# Patient Record
Sex: Male | Born: 1995 | Race: Black or African American | Hispanic: No | Marital: Single | Smoking: Former smoker
Health system: Southern US, Community
[De-identification: ages and names within clinical notes are randomized; demographics above are authoritative.]

---
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
## 2015-01-18 ENCOUNTER — Emergency Department (HOSPITAL_COMMUNITY): Payer: Medicaid Other

## 2015-01-18 ENCOUNTER — Encounter (HOSPITAL_COMMUNITY): Payer: Self-pay | Admitting: Emergency Medicine

## 2015-01-18 ENCOUNTER — Emergency Department (HOSPITAL_COMMUNITY)
Admission: EM | Admit: 2015-01-18 | Discharge: 2015-01-18 | Disposition: A | Discharge status: Home / Self Care | Payer: Medicaid Other | Attending: Emergency Medicine | Admitting: Emergency Medicine

## 2015-01-18 DIAGNOSIS — W228XXA Striking against or struck by other objects, initial encounter: Secondary | ICD-10-CM | POA: Diagnosis not present

## 2015-01-18 DIAGNOSIS — S61201A Unspecified open wound of left index finger without damage to nail, initial encounter: Secondary | ICD-10-CM | POA: Diagnosis not present

## 2015-01-18 DIAGNOSIS — Y9389 Activity, other specified: Secondary | ICD-10-CM | POA: Insufficient documentation

## 2015-01-18 DIAGNOSIS — Y998 Other external cause status: Secondary | ICD-10-CM | POA: Diagnosis not present

## 2015-01-18 DIAGNOSIS — Z87891 Personal history of nicotine dependence: Secondary | ICD-10-CM | POA: Insufficient documentation

## 2015-01-18 DIAGNOSIS — S6992XA Unspecified injury of left wrist, hand and finger(s), initial encounter: Secondary | ICD-10-CM

## 2015-01-18 DIAGNOSIS — Y9289 Other specified places as the place of occurrence of the external cause: Secondary | ICD-10-CM | POA: Insufficient documentation

## 2015-01-18 DIAGNOSIS — Z23 Encounter for immunization: Secondary | ICD-10-CM | POA: Diagnosis not present

## 2015-01-18 DIAGNOSIS — S61211A Laceration without foreign body of left index finger without damage to nail, initial encounter: Secondary | ICD-10-CM | POA: Diagnosis present

## 2015-01-18 MED ORDER — IBUPROFEN 600 MG PO TABS: 600.0000 mg | ORAL_TABLET | Freq: Four times a day (QID) | ORAL | Status: DC | PRN

## 2015-01-18 MED ORDER — TETANUS-DIPHTH-ACELL PERTUSSIS 5-2.5-18.5 LF-MCG/0.5 IM SUSP
0.5000 mL | Freq: Once | INTRAMUSCULAR | Status: AC
  Administered 2015-01-18: 0.5 mL via INTRAMUSCULAR
  Filled 2015-01-18: qty 0.5

## 2015-01-18 NOTE — ED Notes (Signed)
Pt c/o left index finger laceration. States he closed his finger in the car door. Bleeding controlled. Ice applied in triage.

## 2015-01-18 NOTE — ED Provider Notes (Signed)
CSN: [id number]     Arrival date & time 01/18/15  1539 History  This chart was scribed for non-physician practitioner, Joycie Peek, PA-C working with Elwin Mocha, MD by Placido Sou, ED scribe. This patient was seen in room WTR8/WTR8 and the patient's care was started at 4:59 PM.   Chief Complaint  Patient presents with  . Finger Injury   The history is provided by the patient. No language interpreter was used.    HPI Comments: Jon Hayes is a 19 y.o. male who presents to the Emergency Department complaining of a laceration with controlled bleeding to his left index finger with onset at 9:00 am. Pt notes slamming his finger in a car door which resulted in his laceration. He notes associated, constant, moderate, pain to the affected finger and further notes wrapping the finger and applying peroxide to the wound until the bleeding was controlled. Pt is unsure of his last tdap vaccination. He denies any other associated symptoms.   History reviewed. No pertinent past medical history. Past Surgical History  Procedure Laterality Date  . Wisdom tooth extraction Bilateral 2013   History reviewed. No pertinent family history. Social History  Substance Use Topics  . Smoking status: Former Games developer  . Smokeless tobacco: None  . Alcohol Use: No    Review of Systems  Musculoskeletal: Positive for arthralgias.  Skin: Positive for wound.  All other systems reviewed and are negative.  Allergies  Review of patient's allergies indicates no known allergies.  Home Medications   Prior to Admission medications   Medication Sig Start Date End Date Taking? Authorizing Provider  ibuprofen (ADVIL,MOTRIN) 600 MG tablet Take 1 tablet (600 mg total) by mouth every 6 (six) hours as needed. 01/18/15   Joycie Peek, PA-C   BP 116/66 mmHg  Pulse 78  Temp(Src) 98.9 F (37.2 C) (Oral)  Resp 16  SpO2 98% Physical Exam  Constitutional: He is oriented to person, place, and time. He appears  well-developed and well-nourished.  HENT:  Head: Normocephalic and atraumatic.  Mouth/Throat: No oropharyngeal exudate.  Eyes: Conjunctivae and EOM are normal. Right eye exhibits no discharge. Left eye exhibits no discharge.  Neck: Normal range of motion. No tracheal deviation present.  Cardiovascular: Normal rate, regular rhythm, normal heart sounds and intact distal pulses.   Pulmonary/Chest: Effort normal. No respiratory distress.  Abdominal: Soft. There is no tenderness.  Musculoskeletal: Normal range of motion.  Diffuse tenderness to distal aspect of left index finger; no subungual hematoma; linear skin tear across volar DIP joint; very superficial; ROM decreased secondary to pain; FROM of PIP  Neurological: He is alert and oriented to person, place, and time.  Motor and sensation intact.  Skin: Skin is warm and dry. He is not diaphoretic.  Psychiatric: He has a normal mood and affect. His behavior is normal.  Nursing note and vitals reviewed.  ED Course  Procedures  DIAGNOSTIC STUDIES: Oxygen Saturation is 98% on RA, normal by my interpretation.    COORDINATION OF CARE: 5:04 PM Discussed treatment plan with pt at bedside including x-rays of the affected finger, a tdap vaccination and recommendations for pain management. Pt agreed to plan.  Labs Review Labs Reviewed - No data to display  Imaging Review Dg Finger Index Left  01/18/2015   CLINICAL DATA:  Car door injury today to index finger with acute pain. Initial encounter.  EXAM: LEFT INDEX FINGER 2+V  COMPARISON:  None.  FINDINGS: There is no evidence of acute fracture, subluxation or dislocation.  Soft tissue swelling is present.  The joint spaces are unremarkable.  No radiopaque foreign bodies are identified.  IMPRESSION: Soft tissue swelling without bony abnormality.   Electronically Signed   By: Harmon Pier M.D.   On: 01/18/2015 16:32   I have personally reviewed and evaluated these images and lab results as part of my  medical decision-making.   EKG Interpretation None     Meds given in ED:  Medications  Tdap (BOOSTRIX) injection 0.5 mL (not administered)    New Prescriptions   IBUPROFEN (ADVIL,MOTRIN) 600 MG TABLET    Take 1 tablet (600 mg total) by mouth every 6 (six) hours as needed.   Filed Vitals:   01/18/15 1556  BP: 116/66  Pulse: 78  Temp: 98.9 F (37.2 C)  TempSrc: Oral  Resp: 16  SpO2: 98%    MDM  Vitals stable - WNL -afebrile Pt resting comfortably in ED. PE--Patient was neurovascularly intact with mild swelling to distal aspect of index finger. Minor skin tear over volar aspect of the DIP No evidence of open fracture. Skin tear does not need repair. Labwork--Tetanus updated in the ED. Imaging--x-rays of left index finger negative for any fracture or other osseous abnormality.  Discussed symptomatic care home with rest, ice, compression and elevation Anti-inflammatory for pain.  I discussed all relevant lab findings and imaging results with pt and they verbalized understanding. Discussed f/u with PCP within 48 hrs and return precautions, pt very amenable to plan.  Final diagnoses:  Finger injury, left, initial encounter   I personally performed the services described in this documentation, which was scribed in my presence. The recorded information has been reviewed and is accurate.    Joycie Peek, PA-C 01/18/15 1721  Elwin Mocha, MD 01/18/15 [phone]

## 2015-01-18 NOTE — Discharge Instructions (Signed)
You were evaluated in the ED for your finger injury. There does not appear to be an emergent cause her symptoms at this time. Urine exam and x-rays are reassuring. There are no broken bones or dislocations.It is important for you to keep the wound clean, using warm water and soap to clean the wound. It may also use ice, compression and elevation to help with the discomfort. Take Motrin as needed for your pain. Follow-up with your doctors as needed. Return to ED for worsening symptoms.

## 2015-06-29 ENCOUNTER — Emergency Department (HOSPITAL_COMMUNITY)
Admission: EM | Admit: 2015-06-29 | Discharge: 2015-06-29 | Disposition: A | Discharge status: Home / Self Care | Payer: Medicaid Other | Attending: Emergency Medicine | Admitting: Emergency Medicine

## 2015-06-29 ENCOUNTER — Encounter (HOSPITAL_COMMUNITY): Payer: Self-pay

## 2015-06-29 DIAGNOSIS — S01511A Laceration without foreign body of lip, initial encounter: Secondary | ICD-10-CM | POA: Diagnosis not present

## 2015-06-29 DIAGNOSIS — Y998 Other external cause status: Secondary | ICD-10-CM | POA: Insufficient documentation

## 2015-06-29 DIAGNOSIS — Y9301 Activity, walking, marching and hiking: Secondary | ICD-10-CM | POA: Diagnosis not present

## 2015-06-29 DIAGNOSIS — W19XXXA Unspecified fall, initial encounter: Secondary | ICD-10-CM

## 2015-06-29 DIAGNOSIS — Y92009 Unspecified place in unspecified non-institutional (private) residence as the place of occurrence of the external cause: Secondary | ICD-10-CM | POA: Diagnosis not present

## 2015-06-29 DIAGNOSIS — Z87891 Personal history of nicotine dependence: Secondary | ICD-10-CM | POA: Diagnosis not present

## 2015-06-29 DIAGNOSIS — W108XXA Fall (on) (from) other stairs and steps, initial encounter: Secondary | ICD-10-CM | POA: Diagnosis not present

## 2015-06-29 DIAGNOSIS — S0993XA Unspecified injury of face, initial encounter: Secondary | ICD-10-CM | POA: Diagnosis present

## 2015-06-29 MED ORDER — LIDOCAINE HCL (PF) 1 % IJ SOLN
5.0000 mL | Freq: Once | INTRAMUSCULAR | Status: DC
  Filled 2015-06-29: qty 5

## 2015-06-29 MED ORDER — BACITRACIN ZINC 500 UNIT/GM EX OINT
TOPICAL_OINTMENT | CUTANEOUS | Status: DC | PRN
  Filled 2015-06-29: qty 0.9

## 2015-06-29 NOTE — ED Notes (Signed)
Per EMS, pt from home.  Pt fell down steps.  Cut to lip.  No head injury.  No LOC.  Bleeding controlled.   Vitals: 144/70, hr 88, resp 18,

## 2015-06-29 NOTE — Discharge Instructions (Signed)
Laceration Care, Adult  A laceration is a cut that goes through all layers of the skin. The cut also goes into the tissue that is right under the skin. Some cuts heal on their own. Others need to be closed with stitches (sutures), staples, skin adhesive strips, or wound glue. Taking care of your cut lowers your risk of infection and helps your cut to heal better.  HOW TO TAKE CARE OF YOUR CUT  For stitches or staples:  · Keep the wound clean and dry.  · If you were given a bandage (dressing), you should change it at least one time per day or as told by your doctor. You should also change it if it gets wet or dirty.  · Keep the wound completely dry for the first 24 hours or as told by your doctor. After that time, you may take a shower or a bath. However, make sure that the wound is not soaked in water until after the stitches or staples have been removed.  · Clean the wound one time each day or as told by your doctor:    Wash the wound with soap and water.    Rinse the wound with water until all of the soap comes off.    Pat the wound dry with a clean towel. Do not rub the wound.  · After you clean the wound, put a thin layer of antibiotic ointment on it as told by your doctor. This ointment:    Helps to prevent infection.    Keeps the bandage from sticking to the wound.  · Have your stitches or staples removed as told by your doctor.  If your doctor used skin adhesive strips:   · Keep the wound clean and dry.  · If you were given a bandage, you should change it at least one time per day or as told by your doctor. You should also change it if it gets dirty or wet.  · Do not get the skin adhesive strips wet. You can take a shower or a bath, but be careful to keep the wound dry.  · If the wound gets wet, pat it dry with a clean towel. Do not rub the wound.  · Skin adhesive strips fall off on their own. You can trim the strips as the wound heals. Do not remove any strips that are still stuck to the wound. They will  fall off after a while.  If your doctor used wound glue:  · Try to keep your wound dry, but you may briefly wet it in the shower or bath. Do not soak the wound in water, such as by swimming.  · After you take a shower or a bath, gently pat the wound dry with a clean towel. Do not rub the wound.  · Do not do any activities that will make you really sweaty until the skin glue has fallen off on its own.  · Do not apply liquid, cream, or ointment medicine to your wound while the skin glue is still on.  · If you were given a bandage, you should change it at least one time per day or as told by your doctor. You should also change it if it gets dirty or wet.  · If a bandage is placed over the wound, do not let the tape for the bandage touch the skin glue.  · Do not pick at the glue. The skin glue usually stays on for 5-10 days. Then, it   falls off of the skin.  General Instructions   · To help prevent scarring, make sure to cover your wound with sunscreen whenever you are outside after stitches are removed, after adhesive strips are removed, or when wound glue stays in place and the wound is healed. Make sure to wear a sunscreen of at least 30 SPF.  · Take over-the-counter and prescription medicines only as told by your doctor.  · If you were given antibiotic medicine or ointment, take or apply it as told by your doctor. Do not stop using the antibiotic even if your wound is getting better.  · Do not scratch or pick at the wound.  · Keep all follow-up visits as told by your doctor. This is important.  · Check your wound every day for signs of infection. Watch for:    Redness, swelling, or pain.    Fluid, blood, or pus.  · Raise (elevate) the injured area above the level of your heart while you are sitting or lying down, if possible.  GET HELP IF:  · You got a tetanus shot and you have any of these problems at the injection site:    Swelling.    Very bad pain.    Redness.    Bleeding.  · You have a fever.  · A wound that was  closed breaks open.  · You notice a bad smell coming from your wound or your bandage.  · You notice something coming out of the wound, such as wood or glass.  · Medicine does not help your pain.  · You have more redness, swelling, or pain at the site of your wound.  · You have fluid, blood, or pus coming from your wound.  · You notice a change in the color of your skin near your wound.  · You need to change the bandage often because fluid, blood, or pus is coming from the wound.  · You start to have a new rash.  · You start to have numbness around the wound.  GET HELP RIGHT AWAY IF:  · You have very bad swelling around the wound.  · Your pain suddenly gets worse and is very bad.  · You notice painful lumps near the wound or on skin that is anywhere on your body.  · You have a red streak going away from your wound.  · The wound is on your hand or foot and you cannot move a finger or toe like you usually can.  · The wound is on your hand or foot and you notice that your fingers or toes look pale or bluish.     This information is not intended to replace advice given to you by your health care provider. Make sure you discuss any questions you have with your health care provider.     Document Released: 10/20/2007 Document Revised: 09/17/2014 Document Reviewed: 04/29/2014  Elsevier Interactive Patient Education ©2016 [employer]

## 2015-06-29 NOTE — ED Provider Notes (Signed)
History  By signing my name below, I, Karle Plumber, attest that this documentation has been prepared under the direction and in the presence of Cheri Fowler, PA-C. Electronically Signed: Karle Plumber, ED Scribe. 06/29/2015. 1:11 PM.  Chief Complaint  Patient presents with  . Facial Laceration   The history is provided by the patient and medical records. No language interpreter was used.    HPI Comments:  Jon Hayes is a 20 y.o. male brought in by EMS, who presents to the Emergency Department complaining of a fall down approximately 6 stairs that occurred about one hour ago. He states his he tripped while walking down the stairs. He reports hitting his top lip on the banister causing a laceration. He reports associated bleeding that has since been controlled. He has not done anything to treat the symptoms. He denies modifying factors. He denies LOC, HA, neck pain, light-headedness, dizziness, nausea, vomiting. He states his tetanus vaccination is UTD.   History reviewed. No pertinent past medical history. Past Surgical History  Procedure Laterality Date  . Wisdom tooth extraction Bilateral 2013   History reviewed. No pertinent family history. Social History  Substance Use Topics  . Smoking status: Former Games developer  . Smokeless tobacco: None  . Alcohol Use: No    Review of Systems A complete 10 system review of systems was obtained and all systems are negative except as noted in the HPI and PMH.   Allergies  Review of patient's allergies indicates no known allergies.  Home Medications   Prior to Admission medications   Medication Sig Start Date End Date Taking? Authorizing Provider  ibuprofen (ADVIL,MOTRIN) 600 MG tablet Take 1 tablet (600 mg total) by mouth every 6 (six) hours as needed. 01/18/15   Joycie Peek, PA-C   Triage Vitals: BP 128/78 mmHg  Pulse 69  Temp(Src) 98.5 F (36.9 C) (Oral)  Resp 16  SpO2 99% Physical Exam  Constitutional: He is oriented to  person, place, and time. He appears well-developed and well-nourished.  HENT:  Head: Normocephalic. Head is with laceration. Head is without raccoon's eyes, without Battle's sign, without abrasion and without contusion.  Right Ear: External ear normal.  Left Ear: External ear normal.  0.5 cm laceration to the right upper lip crossing the vermilion border. Eating controlled with pressure. No visualization or palpation of foreign bodies within a bloodless field.  Eyes: Conjunctivae are normal. No scleral icterus.  Neck: Normal range of motion. No tracheal deviation present.  No cervical midline tenderness.  Pulmonary/Chest: Effort normal. No respiratory distress.  Abdominal: He exhibits no distension.  Musculoskeletal: Normal range of motion.  Neurological: He is alert and oriented to person, place, and time.  Skin: Skin is warm and dry.  Psychiatric: He has a normal mood and affect. His behavior is normal.    ED Course  Procedures (including critical care time)  LACERATION REPAIR Performed by: Cheri Fowler Consent: Verbal consent obtained. Risks and benefits: risks, benefits and alternatives were discussed Patient identity confirmed: provided demographic data Time out performed prior to procedure Prepped and Draped in normal sterile fashion Wound explored Laceration Location: right upper lip crossing the vermillion border Laceration Length: 0.5cm No Foreign Bodies seen or palpated Anesthesia: local infiltration Local anesthetic: lidocaine 1% with epinephrine Anesthetic total: 2 ml Irrigation method: syringe Amount of cleaning: standard Skin closure: 6-0 prolene Number of sutures or staples: 2 Technique: simple interrupted with the first stitch along the vermillion border Patient tolerance: Patient tolerated the procedure well with no  immediate complications.  DIAGNOSTIC STUDIES: Oxygen Saturation is 99% on RA, normal by my interpretation.   COORDINATION OF CARE: 12:31 PM-  Will order Ibuprofen and suture lip. Pt verbalizes understanding and agrees to plan.  Medications  lidocaine (PF) (XYLOCAINE) 1 % injection 5 mL (not administered)  bacitracin ointment (not administered)     MDM   Final diagnoses:  Fall, initial encounter  Lip laceration, initial encounter   Patient presents with mechanical fall and lip laceration. VSS, NAD. No LOC or AMS. Tetanus up-to-date. Laceration repaired with the first stitch along the vermilion border with good approximation. Patient to return in 4-5 days for suture removal. Patient given bacitracin. Discussed return precautions. Patient agrees and acknowledges the above plan for discharge.   I personally performed the services described in this documentation, which was scribed in my presence. The recorded information has been reviewed and is accurate.     Cheri Fowler, PA-C 06/29/15 1338  Nelva Nay, MD 07/01/15 [phone]

## 2015-07-10 ENCOUNTER — Emergency Department (HOSPITAL_COMMUNITY)
Admission: EM | Admit: 2015-07-10 | Discharge: 2015-07-10 | Disposition: A | Discharge status: Home / Self Care | Payer: Medicaid Other | Attending: Emergency Medicine | Admitting: Emergency Medicine

## 2015-07-10 ENCOUNTER — Encounter (HOSPITAL_COMMUNITY): Payer: Self-pay | Admitting: *Deleted

## 2015-07-10 DIAGNOSIS — Z4802 Encounter for removal of sutures: Secondary | ICD-10-CM | POA: Diagnosis not present

## 2015-07-10 DIAGNOSIS — S01511D Laceration without foreign body of lip, subsequent encounter: Secondary | ICD-10-CM | POA: Insufficient documentation

## 2015-07-10 DIAGNOSIS — X58XXXD Exposure to other specified factors, subsequent encounter: Secondary | ICD-10-CM | POA: Diagnosis not present

## 2015-07-10 NOTE — ED Provider Notes (Signed)
CSN: [id number]     Arrival date & time 07/10/15  1051 History   First MD Initiated Contact with Patient 07/10/15 1118     Chief Complaint  Patient presents with  . Suture / Staple Removal     (Consider location/radiation/quality/duration/timing/severity/associated sxs/prior Treatment) HPI Comments: Jon Hayes is a 20 y.o. male who presents to the ED with complaints of suture removal. He sustained a lip laceration on the banister of a staircase on 2/12, had 2 sutures placed here in the ER. Laceration has healed well, he has no swelling, erythema, warmth, drainage, pain, or any other issues at the site of the laceration. He denies any fevers, chills, CP, SOB, abd pain, N/V/D/C, hematuria, dysuria, myalgias, arthralgias, numbness, tingling, weakness, or other symptoms.   Patient is a 20 y.o. male presenting with suture removal. The history is provided by the patient. No language interpreter was used.  Suture / Staple Removal This is a new problem. The current episode started 1 to 4 weeks ago. The problem occurs rarely. The problem has been rapidly improving. Pertinent negatives include no abdominal pain, arthralgias, chest pain, chills, fever, myalgias, nausea, numbness, urinary symptoms, vomiting or weakness. Nothing aggravates the symptoms. He has tried nothing for the symptoms. The treatment provided no relief.    History reviewed. No pertinent past medical history. Past Surgical History  Procedure Laterality Date  . Wisdom tooth extraction Bilateral 2013   No family history on file. Social History  Substance Use Topics  . Smoking status: Former Games developer  . Smokeless tobacco: None  . Alcohol Use: No    Review of Systems  Constitutional: Negative for fever and chills.  Respiratory: Negative for shortness of breath.   Cardiovascular: Negative for chest pain.  Gastrointestinal: Negative for nausea, vomiting, abdominal pain, diarrhea and constipation.  Genitourinary: Negative for  dysuria and hematuria.  Musculoskeletal: Negative for myalgias and arthralgias.  Skin: Positive for wound (lip lac, healed, sutures in place). Negative for color change.  Allergic/Immunologic: Negative for immunocompromised state.  Neurological: Negative for weakness and numbness.  Psychiatric/Behavioral: Negative for confusion.   10 Systems reviewed and are negative for acute change except as noted in the HPI.    Allergies  Review of patient's allergies indicates no known allergies.  Home Medications   Prior to Admission medications   Medication Sig Start Date End Date Taking? Authorizing Provider  ibuprofen (ADVIL,MOTRIN) 600 MG tablet Take 1 tablet (600 mg total) by mouth every 6 (six) hours as needed. 01/18/15   Benjamin Cartner, PA-C   BP 126/65 mmHg  Pulse 87  Temp(Src) 98.2 F (36.8 C) (Oral)  Resp 12  Ht  (1.778 m)  Wt 72.576 kg  BMI 22.96 kg/m2  SpO2 97% Physical Exam  Constitutional: He is oriented to person, place, and time. Vital signs are normal. He appears well-developed and well-nourished.  Non-toxic appearance. No distress.  Afebrile, nontoxic, NAD  HENT:  Head: Normocephalic and atraumatic.  Mouth/Throat: Uvula is midline, oropharynx is clear and moist and mucous membranes are normal. No trismus in the jaw. Lacerations (well healed) present. No uvula swelling.    Well healed linear lac to upper lip crossing vermillion border, with 2 sutures in place, no drainage or fluctuance, no erythema or warmth, no swelling. Linear scar very well approximated  Eyes: Conjunctivae and EOM are normal. Right eye exhibits no discharge. Left eye exhibits no discharge.  Neck: Normal range of motion. Neck supple.  Cardiovascular: Normal rate.   Pulmonary/Chest: Effort normal.  No respiratory distress.  Abdominal: Normal appearance. He exhibits no distension.  Musculoskeletal: Normal range of motion.  Neurological: He is alert and oriented to person, place, and time. He has  normal strength. No sensory deficit.  Skin: Skin is warm, dry and intact. No rash noted.  Healed Linear lac to upper lip as noted above  Psychiatric: He has a normal mood and affect.  Nursing note and vitals reviewed.   ED Course  .Suture Removal Date/Time: 07/10/2015 11:33 AM Performed by: Allen Derry Authorized by: Allen Derry Consent: Verbal consent obtained. Risks and benefits: risks, benefits and alternatives were discussed Consent given by: patient Patient understanding: patient states understanding of the procedure being performed Patient consent: the patient's understanding of the procedure matches consent given Patient identity confirmed: verbally with patient Body area: head/neck Location details: upper lip Wound Appearance: clean Sutures Removed: 2 Facility: sutures placed in this facility Patient tolerance: Patient tolerated the procedure well with no immediate complications   (including critical care time) Labs Review Labs Reviewed - No data to display  Imaging Review No results found. I have personally reviewed and evaluated these images and lab results as part of my medical decision-making.   EKG Interpretation None      MDM   Final diagnoses:  Visit for suture removal  Lip laceration, subsequent encounter    20 y.o. male here for suture removal. No issues, scar healing great. 2 sutures removed without issue. Discussed scar minimization with sunscreen. F/up with PCP as needed for future issues with scar. I explained the diagnosis and have given explicit precautions to return to the ER including for any other new or worsening symptoms. The patient understands and accepts the medical plan as it's been dictated and I have answered their questions. Discharge instructions concerning home care and prescriptions have been given. The patient is STABLE and is discharged to home in good condition.  BP 126/65 mmHg  Pulse 87  Temp(Src) 98.2  F (36.8 C) (Oral)  Resp 12  Ht  (1.778 m)  Wt 72.576 kg  BMI 22.96 kg/m2  SpO2 97%  No orders of the defined types were placed in this encounter.       [address] [place], PA-C 07/10/15 1136  Donnetta Hutching, MD 07/10/15 1233

## 2015-07-10 NOTE — Discharge Instructions (Signed)
Use sunscreen to the area where the scar is for the next 1 year to help with minimization of the scar. Ice area for additional pain relief and swelling. Alternate between Ibuprofen and Tylenol for additional pain relief. Follow up with your primary care doctor or the [facility] as needed for any ongoing issues. Monitor area for signs of infection to include, but not limited to: increasing pain, spreading redness, drainage/pus, worsening swelling, or fevers. Return to emergency department for emergent changing or worsening symptoms.    Suture Removal, Care After Refer to this sheet in the next few weeks. These instructions provide you with information on caring for yourself after your procedure. Your health care provider may also give you more specific instructions. Your treatment has been planned according to current medical practices, but problems sometimes occur. Call your health care provider if you have any problems or questions after your procedure. WHAT TO EXPECT AFTER THE PROCEDURE After your stitches (sutures) are removed, it is typical to have the following:  Some discomfort and swelling in the wound area.  Slight redness in the area. HOME CARE INSTRUCTIONS   If you have skin adhesive strips over the wound area, do not take the strips off. They will fall off on their own in a few days. If the strips remain in place after 14 days, you may remove them.  Change any bandages (dressings) at least once a day or as directed by your health care provider. If the bandage sticks, soak it off with warm, soapy water.  Apply cream or ointment only as directed by your health care provider. If using cream or ointment, wash the area with soap and water 2 times a day to remove all the cream or ointment. Rinse off the soap and pat the area dry with a clean towel.  Keep the wound area dry and clean. If the bandage becomes wet or dirty, or if it develops a bad smell, change it as soon as  possible.  Continue to protect the wound from injury.  Use sunscreen when out in the sun. New scars become sunburned easily. SEEK MEDICAL CARE IF:  You have increasing redness, swelling, or pain in the wound.  You see pus coming from the wound.  You have a fever.  You notice a bad smell coming from the wound or dressing.  Your wound breaks open (edges not staying together).   This information is not intended to replace advice given to you by your health care provider. Make sure you discuss any questions you have with your health care provider.   Document Released: 01/26/2001 Document Revised: 02/21/2013 Document Reviewed: 12/13/2012 Elsevier Interactive Patient Education [employer]

## 2015-07-10 NOTE — ED Notes (Signed)
Pt here for suture removal. No s/s if infection.

## 2015-11-04 ENCOUNTER — Other Ambulatory Visit (HOSPITAL_COMMUNITY)
Admission: RE | Admit: 2015-11-04 | Discharge: 2015-11-04 | Disposition: A | Discharge status: Home / Self Care | Payer: Medicaid Other | Source: Ambulatory Visit | Attending: Family Medicine | Admitting: Family Medicine

## 2015-11-04 ENCOUNTER — Other Ambulatory Visit: Payer: Self-pay | Admitting: Family Medicine

## 2015-11-04 DIAGNOSIS — Z113 Encounter for screening for infections with a predominantly sexual mode of transmission: Secondary | ICD-10-CM | POA: Insufficient documentation

## 2015-11-07 LAB — URINE CYTOLOGY ANCILLARY ONLY
Bacterial vaginitis: NEGATIVE
Candida vaginitis: NEGATIVE
Chlamydia: NEGATIVE
NEISSERIA GONORRHEA: NEGATIVE
TRICH (WINDOWPATH): NEGATIVE

## 2016-03-03 ENCOUNTER — Encounter (HOSPITAL_COMMUNITY): Payer: Self-pay

## 2016-03-03 ENCOUNTER — Emergency Department (HOSPITAL_COMMUNITY): Payer: Medicaid Other

## 2016-03-03 ENCOUNTER — Emergency Department (HOSPITAL_COMMUNITY)
Admission: EM | Admit: 2016-03-03 | Discharge: 2016-03-03 | Disposition: A | Discharge status: Home / Self Care | Payer: Medicaid Other | Attending: Emergency Medicine | Admitting: Emergency Medicine

## 2016-03-03 DIAGNOSIS — Z87891 Personal history of nicotine dependence: Secondary | ICD-10-CM | POA: Insufficient documentation

## 2016-03-03 DIAGNOSIS — B349 Viral infection, unspecified: Secondary | ICD-10-CM | POA: Diagnosis not present

## 2016-03-03 DIAGNOSIS — R05 Cough: Secondary | ICD-10-CM | POA: Diagnosis present

## 2016-03-03 LAB — COMPREHENSIVE METABOLIC PANEL
ALT: 25 U/L (ref 17–63)
AST: 24 U/L (ref 15–41)
Albumin: 4 g/dL (ref 3.5–5.0)
Alkaline Phosphatase: 82 U/L (ref 38–126)
Anion gap: 7 (ref 5–15)
BILIRUBIN TOTAL: 0.8 mg/dL (ref 0.3–1.2)
BUN: 8 mg/dL (ref 6–20)
CALCIUM: 9.4 mg/dL (ref 8.9–10.3)
CO2: 27 mmol/L (ref 22–32)
CREATININE: 0.9 mg/dL (ref 0.61–1.24)
Chloride: 106 mmol/L (ref 101–111)
Glucose, Bld: 94 mg/dL (ref 65–99)
Potassium: 3.7 mmol/L (ref 3.5–5.1)
Sodium: 140 mmol/L (ref 135–145)
TOTAL PROTEIN: 7.9 g/dL (ref 6.5–8.1)

## 2016-03-03 LAB — CBC WITH DIFFERENTIAL/PLATELET
BASOS ABS: 0 10*3/uL (ref 0.0–0.1)
Basophils Relative: 0 %
EOS ABS: 0.2 10*3/uL (ref 0.0–0.7)
Eosinophils Relative: 2 %
HCT: 42.6 % (ref 39.0–52.0)
HEMOGLOBIN: 14.7 g/dL (ref 13.0–17.0)
LYMPHS ABS: 1.6 10*3/uL (ref 0.7–4.0)
Lymphocytes Relative: 17 %
MCH: 28.9 pg (ref 26.0–34.0)
MCHC: 34.5 g/dL (ref 30.0–36.0)
MCV: 83.7 fL (ref 78.0–100.0)
MONO ABS: 0.9 10*3/uL (ref 0.1–1.0)
Monocytes Relative: 9 %
NEUTROS ABS: 6.8 10*3/uL (ref 1.7–7.7)
Neutrophils Relative %: 72 %
PLATELETS: 307 10*3/uL (ref 150–400)
RBC: 5.09 MIL/uL (ref 4.22–5.81)
RDW: 12.1 % (ref 11.5–15.5)
WBC: 9.5 10*3/uL (ref 4.0–10.5)

## 2016-03-03 LAB — RAPID STREP SCREEN (MED CTR MEBANE ONLY): STREPTOCOCCUS, GROUP A SCREEN (DIRECT): NEGATIVE

## 2016-03-03 LAB — LIPASE, BLOOD: LIPASE: 16 U/L (ref 11–51)

## 2016-03-03 MED ORDER — IBUPROFEN 800 MG PO TABS: 800.0000 mg | ORAL_TABLET | Freq: Three times a day (TID) | ORAL | 0 refills | Status: DC

## 2016-03-03 MED ORDER — CETIRIZINE HCL 10 MG PO TABS: 10.0000 mg | ORAL_TABLET | Freq: Every day | ORAL | 0 refills | Status: DC

## 2016-03-03 MED ORDER — IBUPROFEN 400 MG PO TABS
800.0000 mg | ORAL_TABLET | Freq: Once | ORAL | Status: AC
  Administered 2016-03-03: 800 mg via ORAL
  Filled 2016-03-03: qty 2

## 2016-03-03 MED ORDER — ACETAMINOPHEN 325 MG PO TABS
650.0000 mg | ORAL_TABLET | Freq: Once | ORAL | Status: AC
  Administered 2016-03-03: 650 mg via ORAL
  Filled 2016-03-03: qty 2

## 2016-03-03 MED ORDER — ONDANSETRON HCL 4 MG PO TABS: 4.0000 mg | ORAL_TABLET | Freq: Four times a day (QID) | ORAL | 0 refills | Status: DC

## 2016-03-03 MED ORDER — ONDANSETRON HCL 4 MG/2ML IJ SOLN
4.0000 mg | Freq: Once | INTRAMUSCULAR | Status: AC
  Administered 2016-03-03: 4 mg via INTRAVENOUS
  Filled 2016-03-03: qty 2

## 2016-03-03 MED ORDER — SODIUM CHLORIDE 0.9 % IV BOLUS (SEPSIS)
1000.0000 mL | Freq: Once | INTRAVENOUS | Status: AC
  Administered 2016-03-03: 1000 mL via INTRAVENOUS

## 2016-03-03 NOTE — ED Provider Notes (Signed)
[facility] DEPT Provider Note   CSN: [id number] Arrival date & time: 03/03/16  1311  By signing my name below, I, Jon Hayes, attest that this documentation has been prepared under the direction and in the presence of Jon Fowler, PA-C. Electronically Signed: Angelene Hayes, ED Scribe. 03/03/16. 2:09 PM.   History   Chief Complaint Chief Complaint  Patient presents with  . Cough  . Nasal Congestion    HPI Comments: Jon Hayes is a 20 y.o. male who presents to the Emergency Department complaining of gradually worsening moderate persistent productive cough with clear sputum onset 9 days ago. He reports associated generalized body aches, nasal congestion, right sinus pressure, chest wall soreness, and bilateral ear pain. He felt lightheaded today which prompted his ED visit.  He adds that he has not been able to eat appropriately because he vomits after eating. He states he had one episode of coughing so hard that it looked like his sputum had red streaks in it.  No frank hemoptysis.  No alleviating factors noted. He states that he has tried Tylenol and Dayquil with no relief. Pt has NKDA. He states that he is also here today because he had an episode this morning where he had difficulty breathing. He denies any recent long car/plane trips, immobilizations, or surgeries. No hx of DVT.  He denies any known fevers, chills, nausea, chest pain, unilateral calf swelling, or any other symptoms.   The history is provided by the patient. No language interpreter was used.    History reviewed. No pertinent past medical history.  There are no active problems to display for this patient.   Past Surgical History:  Procedure Laterality Date  . WISDOM TOOTH EXTRACTION Bilateral 2013       Home Medications    Prior to Admission medications   Medication Sig Start Date End Date Taking? Authorizing Provider  cetirizine (ZYRTEC) 10 MG tablet Take 1 tablet (10 mg total) by mouth  daily. 03/03/16   Jon Fowler, PA-C  ibuprofen (ADVIL,MOTRIN) 800 MG tablet Take 1 tablet (800 mg total) by mouth 3 (three) times daily. 03/03/16   Jeryn Bertoni, PA-C  ondansetron (ZOFRAN) 4 MG tablet Take 1 tablet (4 mg total) by mouth every 6 (six) hours. 03/03/16   Jon Fowler, PA-C    Family History No family history on file.  Social History Social History  Substance Use Topics  . Smoking status: Former Games developer  . Smokeless tobacco: Never Used  . Alcohol use No     Allergies   Review of patient's allergies indicates no known allergies.   Review of Systems Review of Systems  Constitutional: Negative for chills and fever.  HENT: Positive for congestion, ear pain and sinus pressure.   Respiratory: Positive for cough and shortness of breath.   Cardiovascular: Negative for chest pain and leg swelling.  Gastrointestinal: Positive for vomiting. Negative for diarrhea and nausea.  Musculoskeletal: Positive for myalgias.  All other systems reviewed and are negative.    Physical Exam Updated Vital Signs BP 124/76 (BP Location: Left Arm)   Pulse 90   Temp 99.5 F (37.5 C) (Oral)   Resp 18   SpO2 100%   Physical Exam  Constitutional: He is oriented to person, place, and time. He appears well-developed and well-nourished.  Non-toxic appearance. He does not have a sickly appearance. He does not appear ill.  HENT:  Head: Normocephalic and atraumatic.  Right Ear: External ear normal.  Left Ear: External ear normal.  Nose:  Right sinus exhibits maxillary sinus tenderness.  Mouth/Throat: Oropharynx is clear and moist.  Eyes: Conjunctivae are normal. Pupils are equal, round, and reactive to light. No scleral icterus.  Neck: Normal range of motion. Neck supple. No tracheal deviation present.  Cardiovascular: Normal rate and regular rhythm.   No unilateral leg swelling.   Pulmonary/Chest: Effort normal and breath sounds normal. No accessory muscle usage or stridor. No respiratory  distress. He has no wheezes. He has no rhonchi. He has no rales.  Abdominal: Soft. Bowel sounds are normal. He exhibits no distension. There is no tenderness. There is no rebound and no guarding.  Musculoskeletal: Normal range of motion.  Lymphadenopathy:    He has no cervical adenopathy.  Neurological: He is alert and oriented to person, place, and time.  Speech clear without dysarthria.  Skin: Skin is warm and dry.  Psychiatric: He has a normal mood and affect. His behavior is normal.     ED Treatments / Results  DIAGNOSTIC STUDIES: Oxygen Saturation is 100% on RA, normal by my interpretation.    COORDINATION OF CARE: 2:08 PM- Pt advised of plan for treatment and pt agrees. Pt will receive chest x-ray for further evaluation.    Labs (all labs ordered are listed, but only abnormal results are displayed) Labs Reviewed  RAPID STREP SCREEN (NOT AT [facility])  CULTURE, GROUP A STREP [facility]  COMPREHENSIVE METABOLIC PANEL  LIPASE, BLOOD  CBC WITH DIFFERENTIAL/PLATELET    EKG  EKG Interpretation None       Radiology Dg Chest 2 View  Result Date: 03/03/2016 CLINICAL DATA:  Productive cough EXAM: CHEST  2 VIEW COMPARISON:  None. FINDINGS: The heart size and mediastinal contours are within normal limits. Both lungs are clear. The visualized skeletal structures are unremarkable. IMPRESSION: No active cardiopulmonary disease. Electronically Signed   By: Alcide CleverMark  Lukens M.D.   On: 03/03/2016 15:02    Procedures Procedures (including critical care time)  Medications Ordered in ED Medications  sodium chloride 0.9 % bolus 1,000 mL (1,000 mLs Intravenous New Bag/Given 03/03/16 1434)  ibuprofen (ADVIL,MOTRIN) tablet 800 mg (800 mg Oral Given 03/03/16 1434)  acetaminophen (TYLENOL) tablet 650 mg (650 mg Oral Given 03/03/16 1609)  ondansetron (ZOFRAN) injection 4 mg (4 mg Intravenous Given 03/03/16 1608)     Initial Impression / Assessment and Plan / ED Course  Jon FowlerKayla Mikiah Durall, PA-C has  reviewed the triage vital signs and the nursing notes.  Pertinent labs & imaging results that were available during my care of the patient were reviewed by me and considered in my medical decision making (see chart for details).  Clinical Course   Patient presents with cough with associated SOB, nasal congestion, sinus pressure, myalgias, and vomiting.  VSS, NAD.  Patient does not appear septic.  Heart sounds normal, lungs CTAB, abdomen soft and benign without rebound, guarding, or rigidity.  HENT exam revealed mild right maxillary sinus tenderness.  However, do not suspect acute bacterial rhinosinusitis.  Low suspicion for acute infectious or surgical cause.  Low risk Well's criteria, low suspicion for PE.  Likely viral illness such as flu.  Will give fluids, get CXR, and check labs for metabolic abnormalities given vomiting.  CXR clear. EKG without ischemia.  Labs pending.  Patient received IVF, zofran, ibuprofen, and Tylenol in ED.  Patient care hand off to oncoming provider Kerrie BuffaloHope Neese, NP, who will follow up on labs.  Anticipate discharge home with ibuprofen, zofran, and zyrtec.  Follow up [facility].   Final Clinical Impressions(s) /  ED Diagnoses   Final diagnoses:  Viral illness    New Prescriptions New Prescriptions   CETIRIZINE (ZYRTEC) 10 MG TABLET    Take 1 tablet (10 mg total) by mouth daily.   IBUPROFEN (ADVIL,MOTRIN) 800 MG TABLET    Take 1 tablet (800 mg total) by mouth 3 (three) times daily.   ONDANSETRON (ZOFRAN) 4 MG TABLET    Take 1 tablet (4 mg total) by mouth every 6 (six) hours.   I personally performed the services described in this documentation, which was scribed in my presence. The recorded information has been reviewed and is accurate.    Jon Fowler, PA-C 03/03/16 1611    Gwyneth Sprout, MD 03/04/16 2121

## 2016-03-03 NOTE — ED Notes (Signed)
Pt is in stable condition upon d/c and ambulates from ED. 

## 2016-03-03 NOTE — ED Triage Notes (Signed)
Patient complains of cough, productive in nature wirh congestion, chills and body aches x 1 week, NAD

## 2016-03-07 LAB — CULTURE, GROUP A STREP (THRC)

## 2016-05-14 ENCOUNTER — Other Ambulatory Visit: Payer: Self-pay | Admitting: Family Medicine

## 2016-05-14 ENCOUNTER — Other Ambulatory Visit (HOSPITAL_COMMUNITY)
Admission: RE | Admit: 2016-05-14 | Discharge: 2016-05-14 | Disposition: A | Discharge status: Home / Self Care | Payer: Medicaid Other | Source: Ambulatory Visit | Attending: Family Medicine | Admitting: Family Medicine

## 2016-05-14 DIAGNOSIS — Z113 Encounter for screening for infections with a predominantly sexual mode of transmission: Secondary | ICD-10-CM | POA: Diagnosis not present

## 2016-05-20 LAB — URINE CYTOLOGY ANCILLARY ONLY
Bacterial vaginitis: NEGATIVE
CHLAMYDIA, DNA PROBE: POSITIVE — AB
Candida vaginitis: NEGATIVE
NEISSERIA GONORRHEA: POSITIVE — AB
Trichomonas: NEGATIVE

## 2017-02-05 ENCOUNTER — Encounter (HOSPITAL_COMMUNITY): Payer: Self-pay | Admitting: Emergency Medicine

## 2017-02-05 DIAGNOSIS — Z79899 Other long term (current) drug therapy: Secondary | ICD-10-CM | POA: Insufficient documentation

## 2017-02-05 DIAGNOSIS — S61315A Laceration without foreign body of left ring finger with damage to nail, initial encounter: Secondary | ICD-10-CM | POA: Insufficient documentation

## 2017-02-05 DIAGNOSIS — W231XXA Caught, crushed, jammed, or pinched between stationary objects, initial encounter: Secondary | ICD-10-CM | POA: Insufficient documentation

## 2017-02-05 DIAGNOSIS — S60142A Contusion of left ring finger with damage to nail, initial encounter: Secondary | ICD-10-CM | POA: Insufficient documentation

## 2017-02-05 DIAGNOSIS — Y939 Activity, unspecified: Secondary | ICD-10-CM | POA: Insufficient documentation

## 2017-02-05 DIAGNOSIS — Y929 Unspecified place or not applicable: Secondary | ICD-10-CM | POA: Insufficient documentation

## 2017-02-05 DIAGNOSIS — Y998 Other external cause status: Secondary | ICD-10-CM | POA: Insufficient documentation

## 2017-02-05 DIAGNOSIS — Z87891 Personal history of nicotine dependence: Secondary | ICD-10-CM | POA: Insufficient documentation

## 2017-02-05 NOTE — ED Triage Notes (Signed)
Pt got a small cut on his left ring finger with the door of his car. Dressing applied and bleeding controlled.

## 2017-02-06 ENCOUNTER — Emergency Department (HOSPITAL_COMMUNITY): Payer: Self-pay

## 2017-02-06 ENCOUNTER — Emergency Department (HOSPITAL_COMMUNITY)
Admission: EM | Admit: 2017-02-06 | Discharge: 2017-02-06 | Disposition: A | Discharge status: Home / Self Care | Payer: Self-pay | Attending: Emergency Medicine | Admitting: Emergency Medicine

## 2017-02-06 DIAGNOSIS — S60142A Contusion of left ring finger with damage to nail, initial encounter: Secondary | ICD-10-CM

## 2017-02-06 DIAGNOSIS — S6010XA Contusion of unspecified finger with damage to nail, initial encounter: Secondary | ICD-10-CM

## 2017-02-06 DIAGNOSIS — S61315A Laceration without foreign body of left ring finger with damage to nail, initial encounter: Secondary | ICD-10-CM

## 2017-02-06 MED ORDER — IBUPROFEN 400 MG PO TABS
600.0000 mg | ORAL_TABLET | Freq: Once | ORAL | Status: AC
  Administered 2017-02-06: 600 mg via ORAL
  Filled 2017-02-06: qty 1

## 2017-02-06 MED ORDER — BUPIVACAINE HCL (PF) 0.5 % IJ SOLN
10.0000 mL | Freq: Once | INTRAMUSCULAR | Status: AC
  Administered 2017-02-06: 10 mL
  Filled 2017-02-06: qty 10

## 2017-02-06 MED ORDER — IBUPROFEN 600 MG PO TABS: 600.0000 mg | ORAL_TABLET | Freq: Three times a day (TID) | ORAL | 0 refills | Status: DC

## 2017-02-06 NOTE — ED Notes (Signed)
Patient transported to X-ray 

## 2017-02-06 NOTE — ED Notes (Signed)
Pt understood dc material. NAD noted. Script given at dc  

## 2017-02-06 NOTE — ED Provider Notes (Signed)
[facility] Provider Note   CSN: [id number] Arrival date & time: 02/05/17  2320     History   Chief Complaint Chief Complaint  Patient presents with  . Finger Injury    HPI Jon Hayes is a 21 y.o. male.  Patient presents with left finger injury after accidentally slamming it in a car door prior to arrival. No other injury. Tetanus is UTD.   The history is provided by the patient. No language interpreter was used.    History reviewed. No pertinent past medical history.  There are no active problems to display for this patient.   Past Surgical History:  Procedure Laterality Date  . WISDOM TOOTH EXTRACTION Bilateral 2013       Home Medications    Prior to Admission medications   Medication Sig Start Date End Date Taking? Authorizing Provider  cetirizine (ZYRTEC) 10 MG tablet Take 1 tablet (10 mg total) by mouth daily. 03/03/16   Cheri Fowler, PA-C  ibuprofen (ADVIL,MOTRIN) 800 MG tablet Take 1 tablet (800 mg total) by mouth 3 (three) times daily. 03/03/16   Cheri Fowler, PA-C  ondansetron (ZOFRAN) 4 MG tablet Take 1 tablet (4 mg total) by mouth every 6 (six) hours. 03/03/16   Cheri Fowler, PA-C    Family History No family history on file.  Social History Social History  Substance Use Topics  . Smoking status: Former Games developer  . Smokeless tobacco: Never Used  . Alcohol use No     Allergies   Patient has no known allergies.   Review of Systems Review of Systems  Musculoskeletal:       See HPI  Skin: Positive for color change and wound.  Neurological: Positive for numbness.     Physical Exam Updated Vital Signs BP 126/80 (BP Location: Right Arm)   Pulse 95   Temp 98.7 F (37.1 C) (Oral)   Resp 18   Ht  (1.778 m)   Wt 72.6 kg (160 lb)   SpO2 98%   BMI 22.96 kg/m   Physical Exam  Constitutional: He is oriented to person, place, and time. He appears well-developed and well-nourished.  Neck: Normal range of motion.    Pulmonary/Chest: Effort normal.  Musculoskeletal: Normal range of motion. He exhibits tenderness.  Left 4th finger has a complete subungual hematoma. Nail is intact. There is a linear laceration across pad of finger. No active bleeding.   Neurological: He is alert and oriented to person, place, and time.  Skin: Skin is warm and dry.  Psychiatric: He has a normal mood and affect.     ED Treatments / Results  Labs (all labs ordered are listed, but only abnormal results are displayed) Labs Reviewed - No data to display  EKG  EKG Interpretation None       Radiology No results found.  Procedures .Marland KitchenLaceration Repair Date/Time: 02/06/2017 5:15 AM Performed by: Elpidio Anis Authorized by: Elpidio Anis   Consent:    Consent obtained:  Verbal   Consent given by:  Patient Anesthesia (see MAR for exact dosages):    Anesthesia method:  Nerve block   Block location:  Palmar tendon sheath   Block needle gauge:  27 G   Block anesthetic:  Bupivacaine 0.5% w/o epi   Block injection procedure:  Anatomic landmarks identified and introduced needle   Block outcome:  Anesthesia achieved Laceration details:    Location:  Finger   Finger location:  L ring finger   Length (cm):  1 Repair  type:    Repair type:  Simple Pre-procedure details:    Preparation:  Patient was prepped and draped in usual sterile fashion and imaging obtained to evaluate for foreign bodies Exploration:    Hemostasis achieved with:  Direct pressure   Contaminated: no   Treatment:    Area cleansed with:  Saline and Betadine   Amount of cleaning:  Standard Skin repair:    Repair method:  Sutures   Suture size:  4-0   Suture material:  Prolene   Suture technique:  Simple interrupted   Number of sutures:  2 Approximation:    Approximation:  Close   Vermilion border: well-aligned   Post-procedure details:    Dressing:  Non-adherent dressing   Patient tolerance of procedure:  Tolerated well, no immediate  complications Comments:     Bovie cautery tool used to puncture 2 holes in finger nail to allow for decompression of subungual hematoma.    (including critical care time)  Medications Ordered in ED Medications  ibuprofen (ADVIL,MOTRIN) tablet 600 mg (not administered)     Initial Impression / Assessment and Plan / ED Course  I have reviewed the triage vital signs and the nursing notes.  Pertinent labs & imaging results that were available during my care of the patient were reviewed by me and considered in my medical decision making (see chart for details).     Patient presents with left 4th finger crush injury (car door). ?Avulsion fracture at distal tip of tuft of finger.   Wound treated per above treatment note. Tetanus UTD. After care discussed.   Final Clinical Impressions(s) / ED Diagnoses   Final diagnoses:  None   1. Contusion left 4th finger 2. Laceration left 4th finger 3. Subungual hematoma left 4th finger   New Prescriptions New Prescriptions   No medications on file     Elpidio Anis, Cordelia Poche 02/06/17 1610    Cathren Laine, MD 02/08/17 1120

## 2017-02-14 ENCOUNTER — Encounter (HOSPITAL_COMMUNITY): Payer: Self-pay

## 2017-02-14 ENCOUNTER — Emergency Department (HOSPITAL_COMMUNITY)
Admission: EM | Admit: 2017-02-14 | Discharge: 2017-02-14 | Disposition: A | Discharge status: Home / Self Care | Payer: Medicaid Other | Attending: Emergency Medicine | Admitting: Emergency Medicine

## 2017-02-14 DIAGNOSIS — Z87891 Personal history of nicotine dependence: Secondary | ICD-10-CM | POA: Insufficient documentation

## 2017-02-14 DIAGNOSIS — Z4802 Encounter for removal of sutures: Secondary | ICD-10-CM

## 2017-02-14 DIAGNOSIS — S61215D Laceration without foreign body of left ring finger without damage to nail, subsequent encounter: Secondary | ICD-10-CM | POA: Insufficient documentation

## 2017-02-14 DIAGNOSIS — Z79899 Other long term (current) drug therapy: Secondary | ICD-10-CM | POA: Insufficient documentation

## 2017-02-14 DIAGNOSIS — W230XXD Caught, crushed, jammed, or pinched between moving objects, subsequent encounter: Secondary | ICD-10-CM | POA: Insufficient documentation

## 2017-02-14 NOTE — Discharge Instructions (Signed)
Your finger should gradually heal. The nail may fall off, if this occurs please keep the finger covered with a dressing for approximately one week. Return for increased pain swelling bleeding or pus from the wound.

## 2017-02-14 NOTE — ED Provider Notes (Signed)
[facility] Provider Note   CSN: [id number] Arrival date & time: 02/14/17  1913     History   Chief Complaint Chief Complaint  Patient presents with  . Suture / Staple Removal    HPI Jon Hayes is a 21 y.o. male.  HPI  The patient presents for suture removal of his left ring finger. He had 2 stitches placed in the finger after getting it slammed in a car door. He had the drainage of a subungual hematoma with trephination. He has done extremely well and his pain is minimal. No complaints today   History reviewed. No pertinent past medical history.  There are no active problems to display for this patient.   Past Surgical History:  Procedure Laterality Date  . WISDOM TOOTH EXTRACTION Bilateral 2013       Home Medications    Prior to Admission medications   Medication Sig Start Date End Date Taking? Authorizing Provider  cetirizine (ZYRTEC) 10 MG tablet Take 1 tablet (10 mg total) by mouth daily. 03/03/16   Cheri Fowler, PA-C  ibuprofen (ADVIL,MOTRIN) 600 MG tablet Take 1 tablet (600 mg total) by mouth 3 (three) times daily. 02/06/17   Elpidio Anis, PA-C  ondansetron (ZOFRAN) 4 MG tablet Take 1 tablet (4 mg total) by mouth every 6 (six) hours. 03/03/16   Cheri Fowler, PA-C    Family History No family history on file.  Social History Social History  Substance Use Topics  . Smoking status: Former Games developer  . Smokeless tobacco: Never Used  . Alcohol use No     Allergies   Patient has no known allergies.   Review of Systems Review of Systems  Constitutional: Negative for fever.  Musculoskeletal: Negative for joint swelling.     Physical Exam Updated Vital Signs BP 125/78   Pulse 89   Temp 98.2 F (36.8 C) (Oral)   Resp 20   SpO2 100%   Physical Exam  Constitutional: He appears well-developed and well-nourished.  HENT:  Head: Normocephalic and atraumatic.  Eyes: Conjunctivae are normal. Right eye exhibits no discharge. Left eye exhibits  no discharge.  Pulmonary/Chest: Effort normal. No respiratory distress.  Neurological: He is alert. Coordination normal.  Skin: Skin is warm and dry. No rash noted. He is not diaphoretic. No erythema.  No redness of the skin, no swelling, no drainage around the sutures, nail of the left fourth finger has a completely dark in appearance but there is no tenseness, no firmness, no fullness. Nontender  Psychiatric: He has a normal mood and affect.  Nursing note and vitals reviewed.    ED Treatments / Results  Labs (all labs ordered are listed, but only abnormal results are displayed) Labs Reviewed - No data to display   Radiology No results found.  Procedures .Suture Removal Date/Time: 02/14/2017 8:49 PM Performed by: Eber Hong Authorized by: Eber Hong   Consent:    Consent obtained:  Verbal   Consent given by:  Patient Location:    Location:  Upper extremity   Upper extremity location:  Hand   Hand location:  L ring finger Procedure details:    Wound appearance:  No signs of infection, good wound healing and nontender   Number of sutures removed:  2 Post-procedure details:    Post-removal:  No dressing applied   Patient tolerance of procedure:  Tolerated well, no immediate complications   (including critical care time)  Medications Ordered in ED Medications - No data to display   Initial  Impression / Assessment and Plan / ED Course  I have reviewed the triage vital signs and the nursing notes.  Pertinent labs & imaging results that were available during my care of the patient were reviewed by me and considered in my medical decision making (see chart for details).     2 sutures removed, patient stable for discharge.  Final Clinical Impressions(s) / ED Diagnoses   Final diagnoses:  Visit for suture removal    New Prescriptions New Prescriptions   No medications on file     Eber Hong, MD 02/14/17 2050

## 2017-02-14 NOTE — ED Triage Notes (Signed)
Pt here for suture removal on finger and also wants his nail looked at.

## 2017-02-14 NOTE — ED Notes (Signed)
ED Provider at bedside. 

## 2017-03-16 ENCOUNTER — Other Ambulatory Visit (HOSPITAL_COMMUNITY)
Admission: RE | Admit: 2017-03-16 | Discharge: 2017-03-16 | Disposition: A | Discharge status: Home / Self Care | Payer: Medicaid Other | Source: Ambulatory Visit | Attending: Family Medicine | Admitting: Family Medicine

## 2017-03-16 ENCOUNTER — Other Ambulatory Visit: Payer: Self-pay | Admitting: Family Medicine

## 2017-03-16 DIAGNOSIS — Z113 Encounter for screening for infections with a predominantly sexual mode of transmission: Secondary | ICD-10-CM | POA: Diagnosis not present

## 2017-03-21 LAB — URINE CYTOLOGY ANCILLARY ONLY
Bacterial vaginitis: NEGATIVE
CANDIDA VAGINITIS: NEGATIVE
CHLAMYDIA, DNA PROBE: POSITIVE — AB
NEISSERIA GONORRHEA: NEGATIVE
Trichomonas: NEGATIVE

## 2017-05-22 ENCOUNTER — Encounter (HOSPITAL_COMMUNITY): Payer: Self-pay | Admitting: Emergency Medicine

## 2017-05-22 ENCOUNTER — Ambulatory Visit (HOSPITAL_COMMUNITY)
Admission: EM | Admit: 2017-05-22 | Discharge: 2017-05-22 | Disposition: A | Discharge status: Home / Self Care | Payer: Medicaid Other | Attending: Urgent Care | Admitting: Urgent Care

## 2017-05-22 DIAGNOSIS — R21 Rash and other nonspecific skin eruption: Secondary | ICD-10-CM

## 2017-05-22 DIAGNOSIS — L299 Pruritus, unspecified: Secondary | ICD-10-CM

## 2017-05-22 NOTE — ED Provider Notes (Addendum)
  MRN: [id number] DOB: 12/15/1995  Subjective:   Jon Hayes is a 22 y.o. male presenting for 3 month history of persistent pruritic rash over torso, arms, buttock area. Patient was prescribed clotrimazole-betamethasone cream without relief. Has tried lotions, soaps, detergents for gentle skin. Denies eating new foods, starting new medications. Patient lives alone and does not have contacts that have had similar rash.   No current facility-administered medications for this encounter.   Current Outpatient Medications:  .  cetirizine (ZYRTEC) 10 MG tablet, Take 1 tablet (10 mg total) by mouth daily., Disp: 30 tablet, Rfl: 0 .  ibuprofen (ADVIL,MOTRIN) 600 MG tablet, Take 1 tablet (600 mg total) by mouth 3 (three) times daily., Disp: 15 tablet, Rfl: 0 .  ondansetron (ZOFRAN) 4 MG tablet, Take 1 tablet (4 mg total) by mouth every 6 (six) hours., Disp: 12 tablet, Rfl: 0   Jon Hayes has No Known Allergies.  Jon Hayes denies past medical history and  has a past surgical history that includes Wisdom tooth extraction (Bilateral, 2013).  Objective:   Vitals: BP 134/77 (BP Location: Left Arm)   Pulse 80   Temp 98.5 F (36.9 C) (Oral)   Resp 16   SpO2 100%   Physical Exam  Constitutional: He is oriented to person, place, and time. He appears well-developed and well-nourished.  Cardiovascular: Normal rate.  Pulmonary/Chest: Effort normal.  Neurological: He is alert and oriented to person, place, and time.  Skin: Skin is warm and dry. Rash (multiple solitary papular nodules scattered over torso, back, hips and mildly over his forearms bilaterally) noted.   Assessment and Plan :   Rash and nonspecific skin eruption  Itching   Patient was very upset that I could not give him a definitive diagnosis. I offered patient treatment for scabies to address that as a source of his rash but he refused this stating that he wants a definitive diagnosis and not to try any medications until he has a definitive  diagnosis. I also counseled that he could try an oral anti-fungal in addition and counseled that it may not work since he has been trying topical antifungal. I offered patient supportive care for itching with Vistaril which he refused. I counseled that he could potentially get a definitive diagnosis for his rash through a dermatologist explaining that he might need a referral which he could obtain through establishing care with a PCP. Patient stated that he wanted a refund because he felt like he was getting nothing. I apologized that I could not give him a definitive diagnosis and offered to ask another provider to evaluate him but he refused this as well. I recommended that if he changed his mind about trying to treat his rash for scabies, he can come back. I also recommended over-the-counter medications for itching since he refused a script for Vistaril. I did not have the opportunity to address any reflux type symptoms given that patient became so upset about not having a definitive diagnosis for his rash.    Wallis BambergMani, Onnie Alatorre, PA-C 05/22/17 1730

## 2017-05-22 NOTE — Discharge Instructions (Signed)
If you change your mind about treating your rash for scabies, you can return to our clinic. Since you declined a prescription for Vistaril, you can try over-the-counter Zyrtec 10mg  and Zantac 150mg . Otherwise, for a definitive diagnosis, I recommend you set up primary care and request a referral to a dermatologist for a consult and/or a skin biopsy if they feel it is necessary. For your concerns about a refund, please request information at check out.

## 2017-05-22 NOTE — ED Triage Notes (Signed)
PT C/O: rash all over body onset  Months.  Sts PCP gave him Clotrimazole-betamethasone for poss fungal w/no relief.   Also c/o acid reflux that increases w/food.... Has tried Pepto bismol w/no relief.   A&O x4... NAD... Ambulatory

## 2018-03-24 ENCOUNTER — Emergency Department (HOSPITAL_COMMUNITY)
Admission: EM | Admit: 2018-03-24 | Discharge: 2018-03-24 | Disposition: A | Discharge status: Home / Self Care | Payer: Medicaid Other | Attending: Emergency Medicine | Admitting: Emergency Medicine

## 2018-03-24 ENCOUNTER — Encounter (HOSPITAL_COMMUNITY): Payer: Self-pay | Admitting: Emergency Medicine

## 2018-03-24 ENCOUNTER — Other Ambulatory Visit: Payer: Self-pay

## 2018-03-24 DIAGNOSIS — Z87891 Personal history of nicotine dependence: Secondary | ICD-10-CM | POA: Insufficient documentation

## 2018-03-24 DIAGNOSIS — L0291 Cutaneous abscess, unspecified: Secondary | ICD-10-CM

## 2018-03-24 DIAGNOSIS — R300 Dysuria: Secondary | ICD-10-CM | POA: Insufficient documentation

## 2018-03-24 DIAGNOSIS — F1210 Cannabis abuse, uncomplicated: Secondary | ICD-10-CM | POA: Insufficient documentation

## 2018-03-24 DIAGNOSIS — N4821 Abscess of corpus cavernosum and penis: Secondary | ICD-10-CM | POA: Insufficient documentation

## 2018-03-24 MED ORDER — DOXYCYCLINE HYCLATE 100 MG PO CAPS: 100.0000 mg | ORAL_CAPSULE | Freq: Two times a day (BID) | ORAL | 0 refills | Status: AC

## 2018-03-24 NOTE — ED Triage Notes (Signed)
Per Pt he had an ingrown hair at the base of his penis that he tried to pop.  Over the course of a week it has gotten larger.  Pt states that I was hurting when he urinated and he also notice some swelling in his left testicle.  He went to his primary care to get test for HIV and other STI/STDs and they were all negative.  PT AOxr4 NAD Noted at this time.

## 2018-03-24 NOTE — ED Provider Notes (Signed)
Emergency Department Provider Note   I have reviewed the triage vital signs and the nursing notes.   HISTORY  Chief Complaint Abscess   HPI Jon Hayes is a 22 y.o. male presents to the emergency department for evaluation of a full lesion at the base of the penis. Symptoms began several days ago.  He went to his primary care physician who tested him for HIV and syphilis which were reportedly negative.  Was tested for other STDs at that time.  He did take a dose of penicillin at a family member x1.  He states that the area got larger looked like an ingrown hair.  The top of the lesion opened and pus drained out.  He states that his PCP did culture this he has not received any results.  He states that since the area popped it is no longer painful and swelling has decreased significantly.  He does have some mild dysuria but no difficulty with urination.  No fevers or chills.   History reviewed. No pertinent past medical history.  There are no active problems to display for this patient.   Past Surgical History:  Procedure Laterality Date  . WISDOM TOOTH EXTRACTION Bilateral 2013   Allergies Patient has no known allergies.  History reviewed. No pertinent family history.  Social History Social History   Tobacco Use  . Smoking status: Former Games developer  . Smokeless tobacco: Never Used  Substance Use Topics  . Alcohol use: No  . Drug use: Yes    Frequency: 7.0 times per week    Types: Marijuana    Review of Systems  Constitutional: No fever/chills Eyes: No visual changes. ENT: No sore throat. Cardiovascular: Denies chest pain. Respiratory: Denies shortness of breath. Gastrointestinal: No abdominal pain.  No nausea, no vomiting.  No diarrhea.  No constipation. Genitourinary: Negative for dysuria. Musculoskeletal: Negative for back pain. Skin: Lesion at the base of the penis.  Neurological: Negative for headaches, focal weakness or numbness.  10-point ROS otherwise  negative.  ____________________________________________   PHYSICAL EXAM:  VITAL SIGNS: ED Triage Vitals  Enc Vitals Group     BP 03/24/18 1450 131/78     Pulse Rate 03/24/18 1450 100     Resp 03/24/18 1450 18     Temp 03/24/18 1450 97.6 F (36.4 C)     Temp Source 03/24/18 1450 Oral     SpO2 03/24/18 1450 100 %     Weight 03/24/18 1451 170 lb (77.1 kg)     Height 03/24/18 1451 5\' 9"  (1.753 m)     Pain Score 03/24/18 1450 5   Constitutional: Alert and oriented. Well appearing and in no acute distress. Eyes: Conjunctivae are normal.  Head: Atraumatic. Nose: No congestion/rhinnorhea. Mouth/Throat: Mucous membranes are moist.  Neck: No stridor.  Cardiovascular: Good peripheral circulation.  Respiratory: Normal respiratory effort.   Gastrointestinal: No distention.  Genitourinary: 1 cm lesion unroofed. No surrounding induration or fluctuance. No active drainage. Normal testicle exam.  Musculoskeletal: No lower extremity tenderness nor edema.  Neurologic:  Normal speech and language. No gross focal neurologic deficits are appreciated.  Skin:  Skin is warm, dry and intact. Lesion as noted above.   ____________________________________________  RADIOLOGY  None ____________________________________________   PROCEDURES  Procedure(s) performed:   Procedures  None ____________________________________________   INITIAL IMPRESSION / ASSESSMENT AND PLAN / ED COURSE  Pertinent labs & imaging results that were available during my care of the patient were reviewed by me and considered in my  medical decision making (see chart for details).  Patient presents to the emergency department with a lesion at the base of the pain is.  Was initially painful, swollen, red and then began to drain pus.  Swelling has decreased significantly.  I considered primary syphilis but feel this is much less likely given the history of the lesion.  I do plan to add doxycycline for further coverage of  possible abscess.  Patient states he was tested for HIV, syphilis, STDs by his PCP.  I advised that he follow-up in 1 month for additional testing for STD.   At this time, I do not feel there is any life-threatening condition present. I have reviewed and discussed all results (EKG, imaging, lab, urine as appropriate), exam findings with patient. I have reviewed nursing notes and appropriate previous records.  I feel the patient is safe to be discharged home without further emergent workup. Discussed usual and customary return precautions. Patient and family (if present) verbalize understanding and are comfortable with this plan.  Patient will follow-up with their primary care provider. If they do not have a primary care provider, information for follow-up has been provided to them. All questions have been answered.  _________________________________________  FINAL CLINICAL IMPRESSION(S) / ED DIAGNOSES  Final diagnoses:  Abscess    NEW OUTPATIENT MEDICATIONS STARTED DURING THIS VISIT:  Discharge Medication List as of 03/24/2018  2:57 PM    START taking these medications   Details  doxycycline (VIBRAMYCIN) 100 MG capsule Take 1 capsule (100 mg total) by mouth 2 (two) times daily for 7 days., Starting Fri 03/24/2018, Until Fri 03/31/2018, Print        Note:  This document was prepared using Dragon voice recognition software and may include unintentional dictation errors.  Alona Bene, MD Emergency Medicine    Long, Arlyss Repress, MD 03/24/18 [id number]

## 2018-03-24 NOTE — ED Notes (Signed)
Patient given discharge instructions and verbalized understanding.  Patient stable to discharge at this time.  Patient is alert and oriented to baseline.  No distressed noted at this time.  All belongings taken with the patient at discharge.   

## 2018-03-24 NOTE — Discharge Instructions (Signed)
You were seen in the ED today with an area that seems most consistent with abscess. Your PCP recently performed syphilis testing which you say was negative but I would consider re-testing in 4 weeks. Use condoms with sexual intercourse until the second test is negative. Return to the ED with fever, chills, or worsening symptoms.

## 2018-08-14 ENCOUNTER — Emergency Department (HOSPITAL_COMMUNITY)
Admission: EM | Admit: 2018-08-14 | Discharge: 2018-08-14 | Disposition: A | Discharge status: Home / Self Care | Payer: Self-pay | Attending: Emergency Medicine | Admitting: Emergency Medicine

## 2018-08-14 ENCOUNTER — Encounter (HOSPITAL_COMMUNITY): Payer: Self-pay | Admitting: Emergency Medicine

## 2018-08-14 ENCOUNTER — Other Ambulatory Visit: Payer: Self-pay

## 2018-08-14 ENCOUNTER — Emergency Department (HOSPITAL_COMMUNITY): Payer: Self-pay

## 2018-08-14 DIAGNOSIS — S60032A Contusion of left middle finger without damage to nail, initial encounter: Secondary | ICD-10-CM | POA: Insufficient documentation

## 2018-08-14 DIAGNOSIS — W230XXA Caught, crushed, jammed, or pinched between moving objects, initial encounter: Secondary | ICD-10-CM | POA: Insufficient documentation

## 2018-08-14 DIAGNOSIS — Y939 Activity, unspecified: Secondary | ICD-10-CM | POA: Insufficient documentation

## 2018-08-14 DIAGNOSIS — Y999 Unspecified external cause status: Secondary | ICD-10-CM | POA: Insufficient documentation

## 2018-08-14 DIAGNOSIS — Y929 Unspecified place or not applicable: Secondary | ICD-10-CM | POA: Insufficient documentation

## 2018-08-14 DIAGNOSIS — Z87891 Personal history of nicotine dependence: Secondary | ICD-10-CM | POA: Insufficient documentation

## 2018-08-14 NOTE — ED Provider Notes (Signed)
[facility]-EMERGENCY DEPT Provider Note   CSN: [id number] Arrival date & time: 08/14/18  1312    History   Chief Complaint Chief Complaint  Patient presents with  . Finger Injury    left middle    HPI Jon Hayes is a 23 y.o. male.     Patient is a 23 year old male who presents with an injury to his left middle finger.  He states just prior to arrival he slammed it in a car door.  He has had constant throbbing pain since that time.  He has a little bit of numbness to the distal tip.  He denies any other injuries.  He has not take anything at home for the pain.     History reviewed. No pertinent past medical history.  There are no active problems to display for this patient.   Past Surgical History:  Procedure Laterality Date  . WISDOM TOOTH EXTRACTION Bilateral 2013        Home Medications    Prior to Admission medications   Medication Sig Start Date End Date Taking? Authorizing Provider  cetirizine (ZYRTEC) 10 MG tablet Take 1 tablet (10 mg total) by mouth daily. 03/03/16   Cheri Fowler, PA-C  ibuprofen (ADVIL,MOTRIN) 600 MG tablet Take 1 tablet (600 mg total) by mouth 3 (three) times daily. 02/06/17   Elpidio Anis, PA-C  ondansetron (ZOFRAN) 4 MG tablet Take 1 tablet (4 mg total) by mouth every 6 (six) hours. 03/03/16   Cheri Fowler, PA-C    Family History No family history on file.  Social History Social History   Tobacco Use  . Smoking status: Former Games developer  . Smokeless tobacco: Never Used  Substance Use Topics  . Alcohol use: No  . Drug use: Yes    Frequency: 7.0 times per week    Types: Marijuana     Allergies   Patient has no known allergies.   Review of Systems Review of Systems  Constitutional: Negative for fever.  Gastrointestinal: Negative for nausea and vomiting.  Musculoskeletal: Positive for arthralgias and joint swelling. Negative for back pain and neck pain.  Skin: Negative for wound.  Neurological:  Positive for numbness. Negative for weakness and headaches.     Physical Exam Updated Vital Signs BP 130/77   Pulse 76   Temp 98.5 F (36.9 C) (Oral)   Resp 14   SpO2 96%   Physical Exam Constitutional:      Appearance: He is well-developed.  HENT:     Head: Normocephalic and atraumatic.  Neck:     Musculoskeletal: Normal range of motion and neck supple.  Cardiovascular:     Rate and Rhythm: Normal rate.  Pulmonary:     Effort: Pulmonary effort is normal.  Musculoskeletal:        General: Tenderness present.     Comments: Patient has some swelling and tenderness over the left middle finger at the distal phalanx.  There is a very tiny subungual hematoma but no apparent damage to the nail.  He is able to flex and extend without difficulty.  He is able to flex the distal phalanx in isolation.  No open wounds are noted.  Skin:    General: Skin is warm and dry.  Neurological:     Mental Status: He is alert and oriented to person, place, and time.      ED Treatments / Results  Labs (all labs ordered are listed, but only abnormal results are displayed) Labs Reviewed - No  data to display  EKG None  Radiology Dg Finger Middle Left  Result Date: 08/14/2018 CLINICAL DATA:  Left middle finger car door injury. EXAM: LEFT MIDDLE FINGER 2+V COMPARISON:  Radiographs of February 06, 2017. FINDINGS: There is no evidence of fracture or dislocation. There is no evidence of arthropathy or other focal bone abnormality. Soft tissues are unremarkable. IMPRESSION: Negative. Electronically Signed   By: Lupita Raider, M.D.   On: 08/14/2018 13:50    Procedures Procedures (including critical care time)  Medications Ordered in ED Medications - No data to display   Initial Impression / Assessment and Plan / ED Course  I have reviewed the triage vital signs and the nursing notes.  Pertinent labs & imaging results that were available during my care of the patient were reviewed by me and  considered in my medical decision making (see chart for details).        Patient has no evidence of fracture.  He was placed in a finger splint.  He was advised in ice and elevation.  He was advised use ibuprofen or Tylenol for symptomatic relief.  He was given a referral to follow-up with orthopedics if his symptoms or not improving.  Final Clinical Impressions(s) / ED Diagnoses   Final diagnoses:  Contusion of left middle finger without damage to nail, initial encounter    ED Discharge Orders    None       Rolan Bucco, MD 08/14/18 1541

## 2018-08-14 NOTE — ED Notes (Signed)
Bed: [facility] Expected date:  Expected time:  Means of arrival:  Comments: 

## 2018-08-14 NOTE — ED Triage Notes (Signed)
Pt reports that he shut his left middle finger in car door.

## 2019-10-23 IMAGING — CR LEFT MIDDLE FINGER 2+V
3 series · 3 of 3 positions shown · non-contrast
Comparison: Radiographs February 06, 2017.

CLINICAL DATA: Left middle finger car door injury.

EXAM:
LEFT MIDDLE FINGER 2+V

[x finger pa left]
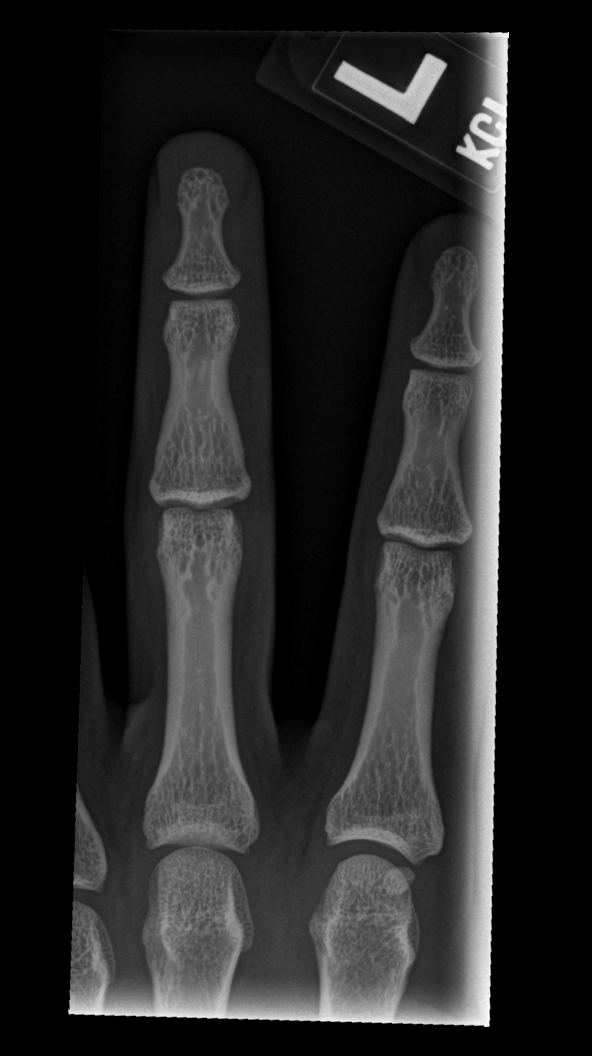

[x finger obl left]
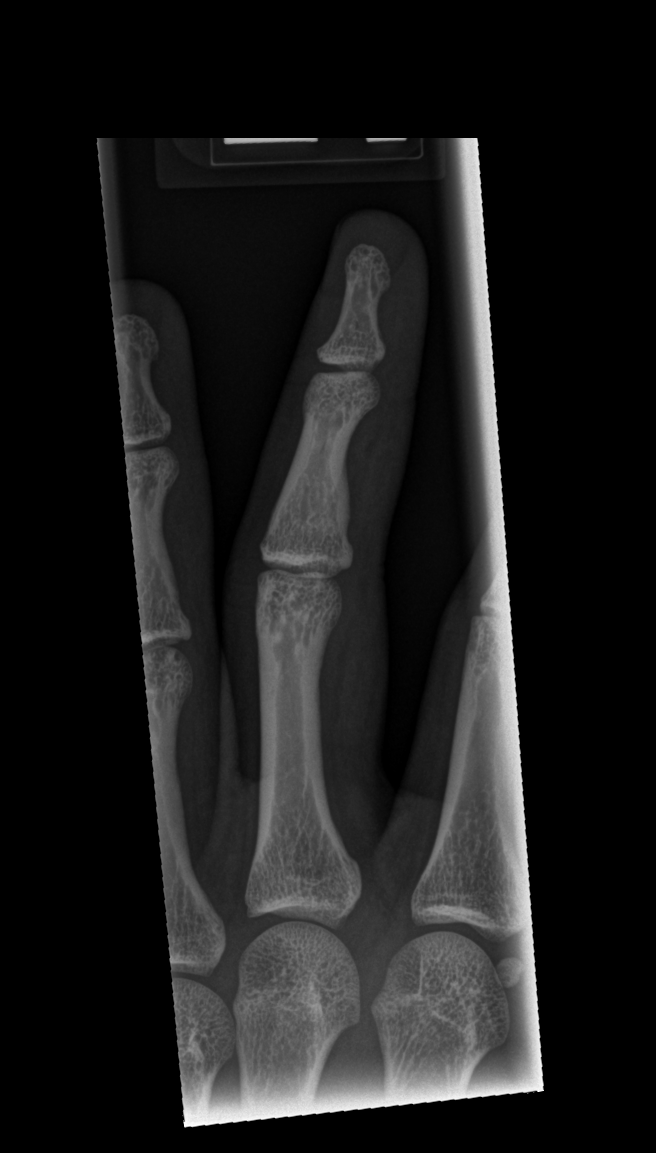

[x finger lat left]
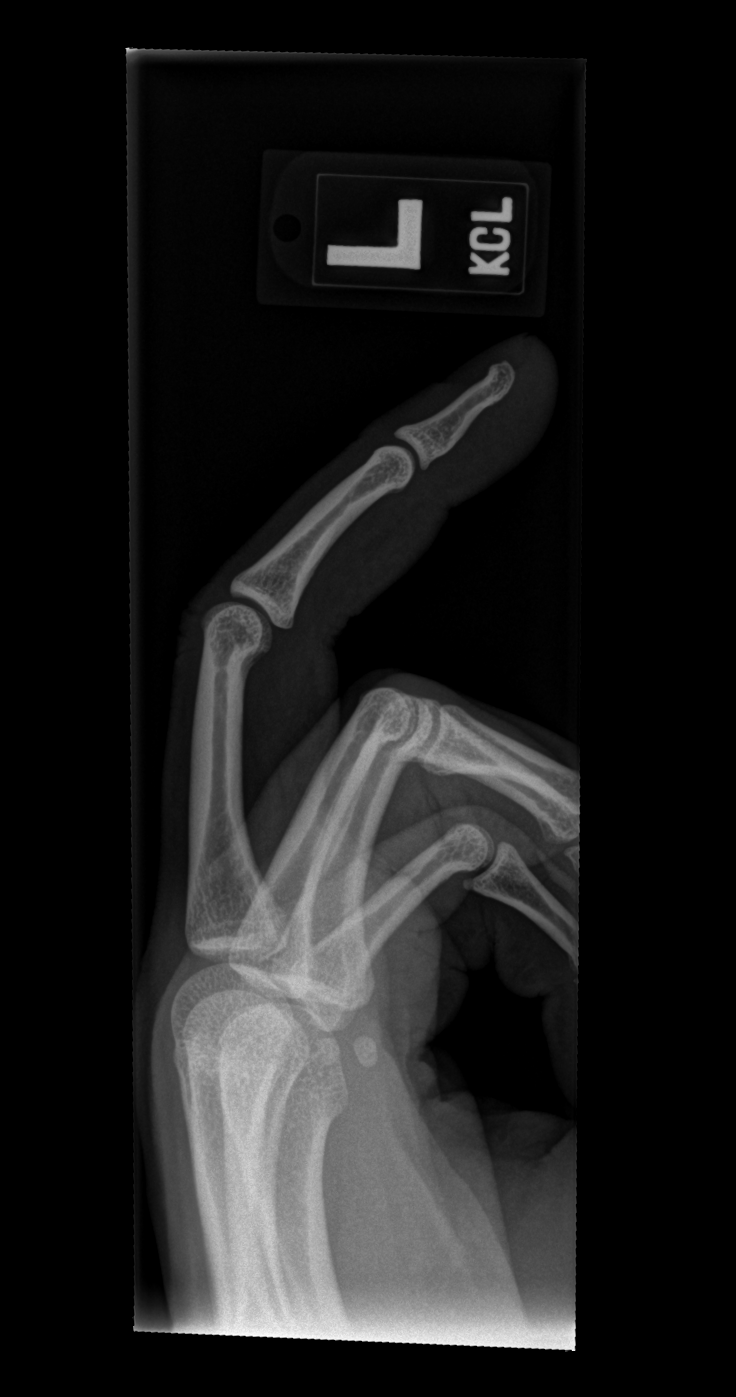

[3 of 3 positions shown; findings below may reference images not displayed]

FINDINGS: There is no evidence of fracture or dislocation. There is no
evidence of arthropathy or other focal bone abnormality. Soft
tissues are unremarkable.
IMPRESSION: Negative.

## 2020-06-01 ENCOUNTER — Other Ambulatory Visit: Payer: Self-pay

## 2020-06-01 ENCOUNTER — Ambulatory Visit (HOSPITAL_COMMUNITY)
Admission: EM | Admit: 2020-06-01 | Discharge: 2020-06-01 | Disposition: A | Discharge status: Home / Self Care | Payer: No Payment, Other | Attending: Family | Admitting: Family

## 2020-06-01 DIAGNOSIS — F4323 Adjustment disorder with mixed anxiety and depressed mood: Secondary | ICD-10-CM | POA: Insufficient documentation

## 2020-06-01 DIAGNOSIS — Z733 Stress, not elsewhere classified: Secondary | ICD-10-CM | POA: Insufficient documentation

## 2020-06-01 DIAGNOSIS — Z658 Other specified problems related to psychosocial circumstances: Secondary | ICD-10-CM | POA: Diagnosis not present

## 2020-06-01 DIAGNOSIS — F322 Major depressive disorder, single episode, severe without psychotic features: Secondary | ICD-10-CM

## 2020-06-01 DIAGNOSIS — Z87891 Personal history of nicotine dependence: Secondary | ICD-10-CM | POA: Insufficient documentation

## 2020-06-01 DIAGNOSIS — F329 Major depressive disorder, single episode, unspecified: Secondary | ICD-10-CM | POA: Diagnosis not present

## 2020-06-01 LAB — POCT URINE DRUG SCREEN - MANUAL ENTRY (I-SCREEN)
POC Methadone UR: NOT DETECTED
POC Morphine: NOT DETECTED
POC Oxazepam (BZO): NOT DETECTED
POC Oxycodone UR: NOT DETECTED
POC Secobarbital (BAR): NOT DETECTED

## 2020-06-01 LAB — POCT URINE DRUG SCREEN - MANUAL ENTRY (I-CUP)
POC Amphetamine UR: NOT DETECTED
POC Buprenorphine (BUP): NOT DETECTED
POC Cocaine UR: NOT DETECTED
POC Marijuana UR: POSITIVE — AB
POC Methamphetamine UR: NOT DETECTED

## 2020-06-01 LAB — POC SARS CORONAVIRUS 2 AG: SARS Coronavirus 2 Ag: NEGATIVE

## 2020-06-01 MED ORDER — HYDROXYZINE HCL 25 MG PO TABS: 25.0000 mg | ORAL_TABLET | Freq: Three times a day (TID) | ORAL | Status: DC | PRN

## 2020-06-01 MED ORDER — TRAZODONE HCL 50 MG PO TABS: 50.0000 mg | ORAL_TABLET | Freq: Every evening | ORAL | Status: DC | PRN

## 2020-06-01 MED ORDER — SERTRALINE HCL 25 MG PO TABS: 25.0000 mg | ORAL_TABLET | Freq: Once | ORAL | Status: DC

## 2020-06-01 MED ORDER — ACETAMINOPHEN 325 MG PO TABS: 650.0000 mg | ORAL_TABLET | Freq: Four times a day (QID) | ORAL | Status: DC | PRN

## 2020-06-01 MED ORDER — ALUM & MAG HYDROXIDE-SIMETH 200-200-20 MG/5ML PO SUSP: 30.0000 mL | ORAL | Status: DC | PRN

## 2020-06-01 MED ORDER — MAGNESIUM HYDROXIDE 400 MG/5ML PO SUSP: 30.0000 mL | Freq: Every day | ORAL | Status: DC | PRN

## 2020-06-01 NOTE — BH Assessment (Signed)
Comprehensive Clinical Assessment (CCA) Note  06/01/2020 Jon Hayes [id number]    Patient came as a walk-in to [facility].  Patient has no history of mental illness and he is presenting because he witnessed his girlfriend shoot and kill herself yesterday.  Patient is distraught and crying and states that he should have done something to prevent her from killing herself.  Patient states that they had been dating for a couple of months.  She had a bipolar diagnosis as well as ADHD and she had chronic suicidal thoughts and made several attempts during the time they were together.  Patient states that they kept breaking up and getting back together.  He states that he was breaking up with her yesterday when she shot herself.  Patient states that he is not suicidal/homicidal and has no evidence of any psychosis.  He states that he is just distraught and states that he keeps seeing the images of the event in his mind.  Patient states that he did not sleep any at all last night and he has very little appetite.  Patient does admit to daily marijuana use.  Patient requesting resources for trauma and grief.  Patient presents as alert and oriented, his mood depressed due to his situation and recent loss.  His thoughts are organized and his memory intact.  His judgment, insight and impulse control are intact.  He does not appear to be responding to any internal stimuli.  Patient is able to contract for safety.  Chief Complaint:  Chief Complaint  Patient presents with  . Depression   Visit Diagnosis: F43.23 Adjustment Disorder with depressed mood   CCA Screening, Triage and Referral (STR)  Patient Reported Information How did you hear about [place]? Self (Phreesia 06/01/2020)  Referral name: [facility]pd (Phreesia 06/01/2020)  Referral phone number: No data recorded  Whom do you see for routine medical problems? Other (Comment) (Phreesia 06/01/2020)  Practice/Facility Name: [facility] (Phreesia 06/01/2020)  Practice/Facility  Phone Number: No data recorded Name of Contact: No data recorded Contact Number: No data recorded Contact Fax Number: No data recorded Prescriber Name: No data recorded Prescriber Address (if known): No data recorded  What Is the Reason for Your Visit/Call Today? Need To Speak To Someone (Phreesia 06/01/2020)  How Long Has This Been Causing You Problems? <Week (Phreesia 06/01/2020)  What Do You Feel Would Help You the Most Today? Therapy (Phreesia 06/01/2020)   Have You Recently Been in Any Inpatient Treatment (Hospital/Detox/Crisis Center/28-Day Program)? No (Phreesia 06/01/2020)  Name/Location of Program/Hospital:No data recorded How Long Were You There? No data recorded When Were You Discharged? No data recorded  Have You Ever Received Services From [facility] Before? No (Phreesia 06/01/2020)  Who Do You See at [facility]? No data recorded  Have You Recently Had Any Thoughts About Hurting Yourself? No (Phreesia 06/01/2020)  Are You Planning to Commit Suicide/Harm Yourself At This time? No (Phreesia 06/01/2020)   Have you Recently Had Thoughts About Hurting Someone Karolee Ohs? No (Phreesia 06/01/2020)  Explanation: No data recorded  Have You Used Any Alcohol or Drugs in the Past 24 Hours? No (Phreesia 06/01/2020)  How Long Ago Did You Use Drugs or Alcohol? No data recorded What Did You Use and How Much? No data recorded  Do You Currently Have a Therapist/Psychiatrist? No (Phreesia 06/01/2020)  Name of Therapist/Psychiatrist: No data recorded  Have You Been Recently Discharged From Any Office Practice or Programs? No (Phreesia 06/01/2020)  Explanation of Discharge From Practice/Program: No data recorded  CCA Screening Triage Referral Assessment Type of Contact: Face-to-Face  Is this Initial or Reassessment? No data recorded Date Telepsych consult ordered in [facility]:  No data recorded Time Telepsych consult ordered in [facility]:  No data recorded  Patient Reported  Information Reviewed? Yes  Patient Left Without Being Seen? No  Reason for Not Completing Assessment: No data recorded  Collateral Involvement: no collateral information available   Does Patient Have a Court Appointed Legal Guardian? No data recorded Name and Contact of Legal Guardian: No data recorded If Minor and Not Living with Parent(s), Who has Custody? No data recorded Is CPS involved or ever been involved? Never  Is APS involved or ever been involved? Never   Patient Determined To Be At Risk for Harm To Self or Others Based on Review of Patient Reported Information or Presenting Complaint? No  Method: No data recorded Availability of Means: No data recorded Intent: No data recorded Notification Required: No data recorded Additional Information for Danger to Others Potential: No data recorded Additional Comments for Danger to Others Potential: No data recorded Are There Guns or Other Weapons in Your Home? No data recorded Types of Guns/Weapons: No data recorded Are These Weapons Safely Secured?                            No data recorded Who Could Verify You Are Able To Have These Secured: No data recorded Do You Have any Outstanding Charges, Pending Court Dates, Parole/Probation? No data recorded Contacted To Inform of Risk of Harm To Self or Others: No data recorded  Location of Assessment: [facility] Assessment Services   Does Patient Present under Involuntary Commitment? No  IVC Papers Initial File Date: No data recorded  [place] of Residence: [place]   Patient Currently Receiving the Following Services: Individual Therapy   Determination of Need: Routine (7 days)   Options For Referral: Outpatient Therapy     CCA Biopsychosocial Intake/Chief Complaint:  Patient came as a walk-in to [facility].  Patient has no history of mental illness and he is presenting because he witnessed his girlfriend shoot and kill herself yesterday.  Patient is distraught and crying and  states that he should have done something to prevent her from killing herself.  Patient states that they had been dating for a couple of months.  She had a bipolar diagnosis as well as ADHD and she had chronic suicidal thoughts and made several attempts during the time they were together.  Patient states that they kept breaking up and getting back together.  He states that he was breaking up with her yesterday when she shot herself.  Patient states that he is not suicidal/homicidal and has no evidence of any psychosis.  He states that he is just distraught and states that he keeps seeing the images of the event in his mind.  Patient states that he did not sleep any at all last night and he has very little appetite.  Patient does admit to daily marijuana use.  Patient requesting resources for trauma and grief.  Current Symptoms/Problems: Unable to sleep and crying.   Patient Reported Schizophrenia/Schizoaffective Diagnosis in Past: No   Strengths: Patient states that he is a strong person  Preferences: Patient states that he does not want to be on any medications  Abilities: Patient states that he is good at helping others and he is a good parent   Type of Services Patient Feels are Needed: Patient  states that he needs therapy   Initial Clinical Notes/Concerns: No data recorded  Mental Health Symptoms Depression:  None   Duration of Depressive symptoms: No data recorded  Mania:  None   Anxiety:   None   Psychosis:  None   Duration of Psychotic symptoms: Less than six months   Trauma:  Re-experience of traumatic event; Difficulty staying/falling asleep   Obsessions:  None   Compulsions:  None   Inattention:  None   Hyperactivity/Impulsivity:  N/A   Oppositional/Defiant Behaviors:  None   Emotional Irregularity:  None   Other Mood/Personality Symptoms:  No data recorded   Mental Status Exam Appearance and self-care  Stature:  Average   Weight:  Thin   Clothing:   Casual   Grooming:  Well-groomed   Cosmetic use:  None   Posture/gait:  Normal   Motor activity:  Not Remarkable   Sensorium  Attention:  Normal   Concentration:  Normal   Orientation:  Object; Person; Place; Situation; Time   Recall/memory:  Normal   Affect and Mood  Affect:  Depressed   Mood:  Depressed   Relating  Eye contact:  Normal   Facial expression:  Depressed   Attitude toward examiner:  Cooperative   Thought and Language  Speech flow: Clear and Coherent   Thought content:  Appropriate to Mood and Circumstances   Preoccupation:  None   Hallucinations:  None   Organization:  No data recorded  Affiliated Computer ServicesExecutive Functions  Fund of Knowledge:  Good   Intelligence:  Above Average   Abstraction:  Normal   Judgement:  Normal   Reality Testing:  Realistic   Insight:  Good   Decision Making:  Normal   Social Functioning  Social Maturity:  Responsible   Social Judgement:  Normal   Stress  Stressors:  Grief/losses   Coping Ability:  Deficient supports   Skill Deficits:  None   Supports:  Family     Religion: Religion/Spirituality Are You A Religious Person?:  (not assessed)  Leisure/Recreation: Leisure / Recreation Do You Have Hobbies?: No  Exercise/Diet: Exercise/Diet Have You Gained or Lost A Significant Amount of Weight in the Past Six Months?: No Do You Follow a Special Diet?: No Do You Have Any Trouble Sleeping?: Yes Explanation of Sleeping Difficulties: states that he could not sleep at all last night   CCA Employment/Education Employment/Work Situation: Employment / Work Situation Employment situation: Employed Where is patient currently employed?: 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 long has patient been 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 Patient's job has been impacted by current illness: No What is the longest time patient has a held a job?: UTA Where was the patient employed at that time?: [employer] Has patient ever been in  the [employer] Lilly and Companymilitary?: No  Education: Education Is Patient Currently Attending School?: No Last Grade Completed: 12 Name of High School: [employer] Did Garment/textile technologistYou Graduate From [employer]High School?: Yes Did Theme park managerYou Attend College?: Yes What Type of College Degree Do you Have?: attended [employer], but did not complete Did You Attend Graduate School?: No Did You Have An Individualized Education Program (IIEP): No Did You Have Any Difficulty At Progress EnergySchool?: No Patient's Education Has Been Impacted by Current Illness: No   CCA Family/Childhood History Family and Relationship History: Family history Marital status: Single Are you sexually active?: Yes What is your sexual orientation?: Heterosexual Has your sexual activity been affected by drugs, alcohol, medication, or emotional stress?: no Does patient have children?: Yes How many children?:  3 How is patient's relationship with their children?: Patient states that he has close relationships with his children  Childhood History:  Childhood History By whom was/is the patient raised?: Mother Additional childhood history information: patient states that his father was not in his life until he was [age] Description of patient's relationship with caregiver when they were a child: Patient states that he was close to his mother growing up, father died in 2019 Patient's description of current relationship with people who raised him/her: Patient states that he still has a close relationship with his mother How were you disciplined when you got in trouble as a child/adolescent?: not assessed Does patient have siblings?: Yes Number of Siblings: [id number] Description of patient's current relationship with siblings: patient states that he is relatively close to his siblings Did patient suffer any verbal/emotional/physical/sexual abuse as a child?: No Did patient suffer from severe childhood neglect?: No Has patient ever been sexually abused/assaulted/raped as an adolescent or adult?: No Was  the patient ever a victim of a crime or a disaster?: No Witnessed domestic violence?: No Has patient been affected by domestic violence as an adult?: No  Child/Adolescent Assessment:     CCA Substance Use Alcohol/Drug Use: Alcohol / Drug Use Pain Medications: see MAR Prescriptions: see MAR Over the Counter: see MAR History of alcohol / drug use?: Yes Longest period of sobriety (when/how long): none reported Substance #1 Name of Substance 1: marijuana 1 - Age of First Use: UTA 1 - Amount (size/oz): 2-3 blunts daily 1 - Frequency: daily 1 - Duration: since onset 1 - Last Use / Amount: today                       ASAM's:  Six Dimensions of Multidimensional Assessment  Dimension 1:  Acute Intoxication and/or Withdrawal Potential:   Dimension 1:  Description of individual's past and current experiences of substance use and withdrawal: Patient has no withdrawal symptoms when he discontinues use of marijuana  Dimension 2:  Biomedical Conditions and Complications:   Dimension 2:  Description of patient's biomedical conditions and  complications: Patient has no medical issues exacerbated by his use of marijuana  Dimension 3:  Emotional, Behavioral, or Cognitive Conditions and Complications:  Dimension 3:  Description of emotional, behavioral, or cognitive conditions and complications: Patient states that he uses marijuana to calm his anxiety  Dimension 4:  Readiness to Change:  Dimension 4:  Description of Readiness to Change criteria: Patient is not expressing any desire to stop using marijuana  Dimension 5:  Relapse, Continued use, or Continued Problem Potential:  Dimension 5:  Relapse, continued use, or continued problem potential critiera description: Patient lacks coping mechanisms to discontinue his marijuna use and is at high risk for relapse  Dimension 6:  Recovery/Living Environment:  Dimension 6:  Recovery/Iiving environment criteria description: Patient has a safe and  supportive environment to recover in  ASAM Severity Score: ASAM's Severity Rating Score: 8  ASAM Recommended Level of Treatment: ASAM Recommended Level of Treatment: Level II Intensive Outpatient Treatment   Substance use Disorder (SUD) Substance Use Disorder (SUD)  Checklist Symptoms of Substance Use: Recurrent use that results in a failure to fulfill major role obligations (work, school, home),Social, occupational, recreational activities given up or reduced due to use,Continued use despite persistent or recurrent social, interpersonal problems, caused or exacerbated by use  Recommendations for Services/Supports/Treatments: Recommendations for Services/Supports/Treatments Recommendations For Services/Supports/Treatments: CD-IOP Intensive Chemical Dependency Program  DSM5 Diagnoses: Patient Active Problem  List   Diagnosis Date Noted  . Adjustment disorder with mixed anxiety and depressed mood     Disposition:  Per Hillery Jacks, NP, patient does not meet inpatient admission criteria   Referrals to Alternative Service(s): Referred to Alternative Service(s):   Place:   Date:   Time:    Referred to Alternative Service(s):   Place:   Date:   Time:    Referred to Alternative Service(s):   Place:   Date:   Time:    Referred to Alternative Service(s):   Place:   Date:   Time:     Shawan Corella J Sartaj Hoskin, LCAS

## 2020-06-01 NOTE — ED Provider Notes (Signed)
Behavioral Health Admission [facility]&P [facility] & OBS)  Date: 06/01/20 Patient Name: Jon Hayes MRN: [id number] Chief Complaint:  Chief Complaint  Patient presents with  . Depression      Diagnoses:  Final diagnoses:  Major depressive disorder, single episode, severe (HCC)    HPI: 17 [place] 25 year old African-American male presents accompanied by [facility]PD citing worsening depression and insomnia.  Reports he witnessed his living girlfriend shoot her self in the head on yesterday.  Patient reports racing thoughts and difficulty following asleep.  He denied suicidal or homicidal ideations.  Denies auditory or visual hallucinations.  Denies previous inpatient admissions.  Denies that he is followed by therapy and/or psychiatry currently.  Discussed overnight observation will initiate Zoloft 25 mg p.o. daily and hydroxyzine for anxiety and sleep.  Support, encouragement and reassurance was provided.  PHQ 2-9:  Flowsheet Row ED from 06/01/2020 in [facility]  Thoughts that you would be better off dead, or of hurting yourself in some way More than half the days  [Phreesia 06/01/2020]  PHQ-9 Total Score 26      Flowsheet Row ED from 06/01/2020 in [facility]  C-SSRS RISK CATEGORY No Risk       Total Time spent with patient: 15 minutes  Musculoskeletal  Strength & Muscle Tone: within normal limits Gait & Station: normal Patient leans: N/A  Psychiatric Specialty Exam  Presentation General Appearance: Appropriate for Environment  Eye Contact:Minimal  Speech:Clear and Coherent  Speech Volume:Decreased  Handedness:Right   Mood and Affect  Mood:Depressed  Affect:Blunt; Congruent   Thought Process  Thought Processes:Coherent  Descriptions of Associations:Intact  Orientation:Full (Time, Place and Person)  Thought Content:Logical; Rumination  Hallucinations:Hallucinations: None  Ideas of Reference:None  Suicidal  Thoughts:Suicidal Thoughts: No  Homicidal Thoughts:Homicidal Thoughts: No   Sensorium  Memory:Immediate Good; Recent Good; Remote Good  Judgment:Good  Insight:Good   Executive Functions  Concentration:Good  Attention Span:Good  Recall:Good  Fund of Knowledge:Good  Language:Good   Psychomotor Activity  Psychomotor Activity:Psychomotor Activity: Normal   Assets  Assets:Desire for Improvement; Intimacy   Sleep  Sleep:Sleep: Poor Number of Hours of Sleep: 3   Physical Exam ROS  Blood pressure 138/88, pulse 98, temperature (!) 97.2 F (36.2 C), temperature source Oral, resp. rate 18, SpO2 98 %. There is no height or weight on file to calculate BMI.  Past Psychiatric History: *  Is the patient at risk to self? No  Has the patient been a risk to self in the past 6 months? No .    Has the patient been a risk to self within the distant past? No   Is the patient a risk to others? No   Has the patient been a risk to others in the past 6 months? No   Has the patient been a risk to others within the distant past? No   Past Medical History: No past medical history on file.  Past Surgical History:  Procedure Laterality Date  . WISDOM TOOTH EXTRACTION Bilateral 2013    Family History: No family history on file.  Social History:  Social History   Socioeconomic History  . Marital status: Single    Spouse name: Not on file  . Number of children: Not on file  . Years of education: Not on file  . Highest education level: Not on file  Occupational History  . Not on file  Tobacco Use  . Smoking status: Former Games developer  . Smokeless tobacco: Never Used  Vaping Use  . Vaping Use: Never used  Substance and Sexual Activity  . Alcohol use: No  . Drug use: Yes    Frequency: 7.0 times per week    Types: Marijuana  . Sexual activity: Yes    Birth control/protection: Condom  Other Topics Concern  . Not on file  Social History Narrative  . Not on file   Social  Determinants of Health   Financial Resource Strain: Not on file  Food Insecurity: Not on file  Transportation Needs: Not on file  Physical Activity: Not on file  Stress: Not on file  Social Connections: Not on file  Intimate Partner Violence: Not on file    SDOH:  SDOH Screenings   Alcohol Screen: Not on file  Depression (PHQ2-9): Medium Risk  . PHQ-2 Score: 26  Financial Resource Strain: Not on file  Food Insecurity: Not on file  Housing: Not on file  Physical Activity: Not on file  Social Connections: Not on file  Stress: Not on file  Tobacco Use: Medium Risk  . Smoking Tobacco Use: Former Smoker  . Smokeless Tobacco Use: Never Used  Transportation Needs: Not on file    Last Labs:    Allergies: Patient has no known allergies.  PTA Medications: (Not in a hospital admission)   Medical Decision Making  Overnight observation Initiate Zoloft 25 mg p.o. daily Initiated trazodone 25 to 50 mg p.o. nightly. Initiated hydroxyzine 25 mg p.o. as needed    Recommendations  Based on my evaluation the patient does not appear to have an emergency medical condition.  Oneta Rack, NP 06/01/20  1:10 PM

## 2020-06-01 NOTE — ED Triage Notes (Signed)
Patient witness girlfriend kill herself yesterday. Patient tearful and depressed. Patient denies SI/HI. Patient denies A/V/H.

## 2020-06-01 NOTE — Discharge Instructions (Signed)
Take all medications as prescribed. Keep all follow-up appointments as scheduled.  Do not consume alcohol or use illegal drugs while on prescription medications. Report any adverse effects from your medications to your primary care provider promptly.  In the event of recurrent symptoms or worsening symptoms, call 911, a crisis hotline, or go to the nearest emergency department for evaluation.   
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
## 2020-06-02 ENCOUNTER — Telehealth (HOSPITAL_COMMUNITY): Payer: Self-pay | Admitting: Professional

## 2021-07-13 ENCOUNTER — Other Ambulatory Visit: Payer: Self-pay

## 2021-07-13 ENCOUNTER — Encounter (HOSPITAL_COMMUNITY): Payer: Self-pay

## 2021-07-13 ENCOUNTER — Emergency Department (HOSPITAL_COMMUNITY)
Admission: EM | Admit: 2021-07-13 | Discharge: 2021-07-14 | Disposition: A | Discharge status: Home / Self Care | Payer: Medicaid Other | Attending: Emergency Medicine | Admitting: Emergency Medicine

## 2021-07-13 DIAGNOSIS — R369 Urethral discharge, unspecified: Secondary | ICD-10-CM | POA: Insufficient documentation

## 2021-07-13 NOTE — ED Triage Notes (Signed)
Pt arrived via POV for c/o white penile dischargex2 days. Pt denies dysuria. Pt c/o abdominal painx1-2 wks.

## 2021-07-14 LAB — URINALYSIS, ROUTINE W REFLEX MICROSCOPIC
Bilirubin Urine: NEGATIVE
Glucose, UA: NEGATIVE mg/dL
Hgb urine dipstick: NEGATIVE
Ketones, ur: NEGATIVE mg/dL
Nitrite: NEGATIVE
Protein, ur: NEGATIVE mg/dL
Specific Gravity, Urine: 1.03 (ref 1.005–1.030)
pH: 5 (ref 5.0–8.0)

## 2021-07-14 LAB — GC/CHLAMYDIA PROBE AMP (~~LOC~~) NOT AT ARMC
Chlamydia: POSITIVE — AB
Comment: NEGATIVE
Comment: NORMAL
Neisseria Gonorrhea: NEGATIVE

## 2021-07-14 MED ORDER — METRONIDAZOLE 500 MG PO TABS
2000.0000 mg | ORAL_TABLET | Freq: Once | ORAL | Status: AC
  Administered 2021-07-14: 2000 mg via ORAL
  Filled 2021-07-14: qty 4

## 2021-07-14 MED ORDER — DOXYCYCLINE HYCLATE 100 MG PO CAPS: 100.0000 mg | ORAL_CAPSULE | Freq: Two times a day (BID) | ORAL | 0 refills | Status: DC

## 2021-07-14 MED ORDER — LIDOCAINE HCL (PF) 1 % IJ SOLN
INTRAMUSCULAR | Status: AC
  Administered 2021-07-14: 5 mL
  Filled 2021-07-14: qty 5

## 2021-07-14 MED ORDER — CEFTRIAXONE SODIUM 500 MG IJ SOLR
500.0000 mg | Freq: Once | INTRAMUSCULAR | Status: AC
  Administered 2021-07-14: 500 mg via INTRAMUSCULAR
  Filled 2021-07-14: qty 500

## 2021-07-14 MED ORDER — LIDOCAINE HCL (PF) 1 % IJ SOLN: 5.0000 mL | Freq: Once | INTRAMUSCULAR | Status: AC

## 2021-07-14 NOTE — ED Provider Notes (Signed)
[facility] EMERGENCY DEPARTMENT Provider Note   CSN: [id number] Arrival date & time: 07/13/21  1639     History  Chief Complaint  Patient presents with   Penile Discharge    Jon Hayes is a 26 y.o. male.  Patient presents to the emergency department with penile discharge.  Patient reports a white discharge from his urethra for 2 days.  He has not had any irritation or dysuria.  He reports that when he has had chlamydia in the past things have been more inflamed.      Home Medications Prior to Admission medications   Medication Sig Start Date End Date Taking? Authorizing Provider  doxycycline (VIBRAMYCIN) 100 MG capsule Take 1 capsule (100 mg total) by mouth 2 (two) times daily. 07/14/21  Yes Itzelle Gains, Gwenyth Allegra, MD  ibuprofen (ADVIL,MOTRIN) 600 MG tablet Take 1 tablet (600 mg total) by mouth 3 (three) times daily. 02/06/17   Charlann Lange, PA-C      Allergies    Patient has no known allergies.    Review of Systems   Review of Systems  Genitourinary:  Positive for penile discharge.   Physical Exam Updated Vital Signs BP (!) 133/91    Pulse 92    Temp 98.4 F (36.9 C) (Oral)    Resp 18    Ht 5\' 9"  (1.753 m)    Wt 77.1 kg    SpO2 95%    BMI 25.10 kg/m  Physical Exam Vitals and nursing note reviewed.  Constitutional:      General: He is not in acute distress.    Appearance: He is well-developed.  HENT:     Head: Normocephalic and atraumatic.     Mouth/Throat:     Mouth: Mucous membranes are moist.  Eyes:     General: Vision grossly intact. Gaze aligned appropriately.     Extraocular Movements: Extraocular movements intact.     Conjunctiva/sclera: Conjunctivae normal.  Cardiovascular:     Rate and Rhythm: Normal rate and regular rhythm.     Pulses: Normal pulses.     Heart sounds: Normal heart sounds, S1 normal and S2 normal. No murmur heard.   No friction rub. No gallop.  Pulmonary:     Effort: Pulmonary effort is normal. No respiratory  distress.     Breath sounds: Normal breath sounds.  Abdominal:     Palpations: Abdomen is soft.     Tenderness: There is no abdominal tenderness. There is no guarding or rebound.     Hernia: No hernia is present.  Musculoskeletal:        General: No swelling.     Cervical back: Full passive range of motion without pain, normal range of motion and neck supple. No pain with movement, spinous process tenderness or muscular tenderness. Normal range of motion.     Right lower leg: No edema.     Left lower leg: No edema.  Skin:    General: Skin is warm and dry.     Capillary Refill: Capillary refill takes less than 2 seconds.     Findings: No ecchymosis, erythema, lesion or wound.  Neurological:     Mental Status: He is alert and oriented to person, place, and time.     GCS: GCS eye subscore is 4. GCS verbal subscore is 5. GCS motor subscore is 6.     Cranial Nerves: Cranial nerves 2-12 are intact.     Sensory: Sensation is intact.     Motor: Motor function  is intact. No weakness or abnormal muscle tone.     Coordination: Coordination is intact.  Psychiatric:        Mood and Affect: Mood normal.        Speech: Speech normal.        Behavior: Behavior normal.    ED Results / Procedures / Treatments   Labs (all labs ordered are listed, but only abnormal results are displayed) Labs Reviewed  URINALYSIS, ROUTINE W REFLEX MICROSCOPIC - Abnormal; Notable for the following components:      Result Value   APPearance HAZY (*)    Leukocytes,Ua MODERATE (*)    Bacteria, UA FEW (*)    All other components within normal limits  GC/CHLAMYDIA PROBE AMP ([place]) NOT AT [facility]    EKG None  Radiology No results found.  Procedures Procedures    Medications Ordered in ED Medications  cefTRIAXone (ROCEPHIN) injection 500 mg (has no administration in time range)  metroNIDAZOLE (FLAGYL) tablet 2,000 mg (has no administration in time range)    ED Course/ Medical Decision Making/ A&P                            Medical Decision Making  Patient with urethral discharge.  Urinalysis consistent with sterile pyuria.  Will treat empirically for STD.        Final Clinical Impression(s) / ED Diagnoses Final diagnoses:  Abnormal penile discharge    Rx / DC Orders ED Discharge Orders          Ordered    doxycycline (VIBRAMYCIN) 100 MG capsule  2 times daily        07/14/21 0126              Orpah Greek, MD 07/14/21 0126

## 2021-08-11 ENCOUNTER — Emergency Department (HOSPITAL_COMMUNITY)
Admission: EM | Admit: 2021-08-11 | Discharge: 2021-08-11 | Disposition: A | Discharge status: Home / Self Care | Payer: Medicaid Other | Attending: Emergency Medicine | Admitting: Emergency Medicine

## 2021-08-11 ENCOUNTER — Other Ambulatory Visit: Payer: Self-pay

## 2021-08-11 DIAGNOSIS — Z113 Encounter for screening for infections with a predominantly sexual mode of transmission: Secondary | ICD-10-CM

## 2021-08-11 DIAGNOSIS — N489 Disorder of penis, unspecified: Secondary | ICD-10-CM

## 2021-08-11 DIAGNOSIS — N4889 Other specified disorders of penis: Secondary | ICD-10-CM | POA: Insufficient documentation

## 2021-08-11 DIAGNOSIS — A64 Unspecified sexually transmitted disease: Secondary | ICD-10-CM | POA: Insufficient documentation

## 2021-08-11 LAB — HIV ANTIBODY (ROUTINE TESTING W REFLEX): HIV Screen 4th Generation wRfx: NONREACTIVE

## 2021-08-11 LAB — RPR: RPR Ser Ql: NONREACTIVE

## 2021-08-11 NOTE — Discharge Instructions (Addendum)
As we discussed on my visual inspection I have low clinical suspicion for active herpes virus lesion.  It is possible that it is an early manifestation so we will send off a swab.  I will also send off some blood tests for HIV, syphilis, as well as you can provide a urine sample of your concern for gonorrhea, chlamydia.  All of these results take 24 to 48 hours.  You can check on your patient portal and follow-up with the health department or return the emergency department if you have concern.  In the meantime I recommend using some gentle moisturizer, or over-the-counter antibiotic wash such as Clindagel or benzyl peroxide to treat as though this is a folliculitis. ? ?While you are waiting for results I recommend refraining from any sexual activity. ?

## 2021-08-11 NOTE — ED Triage Notes (Signed)
Pt. Stated, I have a sore on my penis. It looks like it was from a hair follicle. Its been there for 2 days no burning or problem urinating. I do have unprotected sex. ?

## 2021-08-11 NOTE — ED Provider Notes (Signed)
?[facility] EMERGENCY DEPARTMENT ?Provider Note ? ? ?CSN: [id number] ?Arrival date & time: 08/11/21  0945 ? ?  ? ?History ? ?Chief Complaint  ?Patient presents with  ? lesion on penis  ? ? ?Jon Hayes is a 26 y.o. male with noncontributory past medical history presents with concern for lesion on the penis, wants to rule out HSV versus syphilis versus other.  He reports that he has had unprotected sex with a partner, but has not had any partners with known STI symptoms.  He denies any penile discharge, pain with urination, history of gonorrhea, chlamydia.  He denies previous STI infection.  Patient describes a lesion that started at the base of the penis, in his pubic hair, was initially concern for folliculitis, but had some worsening pain, redness.  Pain and redness have improved but still a small sore present, patient is concerned. ? ?HPI ? ?  ? ?Home Medications ?Prior to Admission medications   ?Medication Sig Start Date End Date Taking? Authorizing Provider  ?doxycycline (VIBRAMYCIN) 100 MG capsule Take 1 capsule (100 mg total) by mouth 2 (two) times daily. 07/14/21   Gilda Crease, MD  ?ibuprofen (ADVIL,MOTRIN) 600 MG tablet Take 1 tablet (600 mg total) by mouth 3 (three) times daily. 02/06/17   Elpidio Anis, PA-C  ?   ? ?Allergies    ?Patient has no known allergies.   ? ?Review of Systems   ?Review of Systems  ?Skin:  Positive for wound.  ?All other systems reviewed and are negative. ? ?Physical Exam ?Updated Vital Signs ?BP (!) 139/99 (BP Location: Right Arm)   Pulse 77   Temp 98 ?F (36.7 ?C) (Oral)   Resp 16   SpO2 99%  ?Physical Exam ?Vitals and nursing note reviewed. Exam conducted with a chaperone present.  ?Constitutional:   ?   General: He is not in acute distress. ?   Appearance: Normal appearance.  ?HENT:  ?   Head: Normocephalic and atraumatic.  ?Eyes:  ?   General:     ?   Right eye: No discharge.     ?   Left eye: No discharge.  ?Cardiovascular:  ?   Rate and  Rhythm: Normal rate and regular rhythm.  ?Pulmonary:  ?   Effort: Pulmonary effort is normal. No respiratory distress.  ?Genitourinary: ?   Comments: Patient with very small excoriation, versus sore noted on the base of the penis on the right side on the dorsal aspect.  There is no active purulent drainage.  There is no purulent drainage from the tip of the penis, no tenderness of the testicles, symmetrical testicles, intact cremasteric reflex. ?Musculoskeletal:     ?   General: No deformity.  ?Skin: ?   General: Skin is warm and dry.  ?Neurological:  ?   Mental Status: He is alert and oriented to person, place, and time.  ?Psychiatric:     ?   Mood and Affect: Mood normal.     ?   Behavior: Behavior normal.  ? ? ?ED Results / Procedures / Treatments   ?Labs ?(all labs ordered are listed, but only abnormal results are displayed) ?Labs Reviewed  ?HIV ANTIBODY (ROUTINE TESTING W REFLEX)  ?RPR  ?HERPES SIMPLEX VIRUS(HSV) DNA BY PCR  ?GC/CHLAMYDIA PROBE AMP ([place]) NOT AT [facility]  ? ? ?EKG ?None ? ?Radiology ?No results found. ? ?Procedures ?Procedures  ? ? ?Medications Ordered in ED ?Medications - No data to display ? ?ED Course/ Medical  Decision Making/ A&P ?  ?                        ?Medical Decision Making ?Amount and/or Complexity of Data Reviewed ?Labs: ordered. ? ? ?Discussed with patient differential diagnosis includes syphilis versus HSV versus folliculitis, nonsexually transmitted excoriation.  Clinically lesion on patient's penis appears more clinically consistent with folliculitis, have low suspicion for HSV.  Discussed that it is difficult to tell for early lesions however.  We will perform PCR swab of the affected lesion, encourage patient to additionally supplied blood work for syphilis, HIV, and gonorrhea chlamydia.  Patient understands and agrees to plan.  Discussed with presumptive diagnosis of folliculitis versus HSV there is no treatment that I would administer at this time.  Minimal clinical  concern that this is an active chancre.  Patient encouraged to check his results in 24 to 48 hours on his patient portal and follow-up either with the emergency department or the health department based on the results.  Encouraged to refrain from sexual activity until results are finalized, and symptoms are resolved.  Patient understands and agrees to plan, is discharged in stable condition at this time. ?Final Clinical Impression(s) / ED Diagnoses ?Final diagnoses:  ?Penile lesion  ?Screen for STD (sexually transmitted disease)  ? ? ?Rx / DC Orders ?ED Discharge Orders   ? ? None  ? ?  ? ? ?  ?Olene Floss, PA-C ?08/11/21 1259 ? ?  ?Gerhard Munch, MD ?08/11/21 1618 ? ?

## 2021-08-11 NOTE — ED Notes (Signed)
Patient Alert and oriented to baseline. Stable and ambulatory to baseline. Patient verbalized understanding of the discharge instructions.  Patient belongings were taken by the patient.   

## 2021-08-13 LAB — HSV DNA BY PCR (REFERENCE LAB)
HSV 1 DNA: NEGATIVE
HSV 2 DNA: POSITIVE — AB

## 2021-08-21 ENCOUNTER — Ambulatory Visit: Payer: Medicaid Other | Admitting: Nurse Practitioner

## 2021-08-27 ENCOUNTER — Ambulatory Visit: Payer: Medicaid Other | Admitting: Nurse Practitioner

## 2021-11-11 ENCOUNTER — Other Ambulatory Visit: Payer: Self-pay

## 2021-11-11 ENCOUNTER — Encounter (HOSPITAL_COMMUNITY): Payer: Self-pay

## 2021-11-11 ENCOUNTER — Emergency Department (HOSPITAL_COMMUNITY)
Admission: EM | Admit: 2021-11-11 | Discharge: 2021-11-11 | Disposition: A | Discharge status: Home / Self Care | Payer: Medicaid Other | Attending: Emergency Medicine | Admitting: Emergency Medicine

## 2021-11-11 DIAGNOSIS — T148XXA Other injury of unspecified body region, initial encounter: Secondary | ICD-10-CM

## 2021-11-11 DIAGNOSIS — S0081XA Abrasion of other part of head, initial encounter: Secondary | ICD-10-CM | POA: Insufficient documentation
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
## 2021-11-11 NOTE — Discharge Instructions (Signed)
Get help right away if: You have a red streak of skin near the area around your wound. Pus or a bad smell coming from the wound. Your wound has been closed with staples, sutures, skin glue, or adhesive strips and it begins to open up and separate. Your wound is bleeding, and the bleeding does not stop with gentle pressure. 
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
## 2022-04-17 ENCOUNTER — Other Ambulatory Visit: Payer: Self-pay

## 2022-04-17 ENCOUNTER — Emergency Department (HOSPITAL_COMMUNITY): Payer: Medicaid Other

## 2022-04-17 ENCOUNTER — Emergency Department (HOSPITAL_COMMUNITY)
Admission: EM | Admit: 2022-04-17 | Discharge: 2022-04-17 | Disposition: A | Discharge status: Home / Self Care | Payer: Medicaid Other | Attending: Emergency Medicine | Admitting: Emergency Medicine

## 2022-04-17 ENCOUNTER — Encounter (HOSPITAL_COMMUNITY): Payer: Self-pay

## 2022-04-17 DIAGNOSIS — M7502 Adhesive capsulitis of left shoulder: Secondary | ICD-10-CM | POA: Diagnosis not present

## 2022-04-17 DIAGNOSIS — M25512 Pain in left shoulder: Secondary | ICD-10-CM | POA: Diagnosis present

## 2022-04-17 MED ORDER — OXYCODONE-ACETAMINOPHEN 5-325 MG PO TABS
1.0000 | ORAL_TABLET | Freq: Once | ORAL | Status: AC
  Administered 2022-04-17: 1 via ORAL
  Filled 2022-04-17: qty 1

## 2022-04-17 MED ORDER — NAPROXEN 500 MG PO TABS: 500.0000 mg | ORAL_TABLET | Freq: Two times a day (BID) | ORAL | 0 refills | Status: DC

## 2022-04-17 MED ORDER — ONDANSETRON 4 MG PO TBDP
8.0000 mg | ORAL_TABLET | Freq: Once | ORAL | Status: AC
  Administered 2022-04-17: 8 mg via ORAL
  Filled 2022-04-17: qty 2

## 2022-04-17 NOTE — ED Provider Triage Note (Signed)
Emergency Medicine Provider Triage Evaluation Note  Jon Hayes , a 26 y.o. male  was evaluated in triage.  Pt complains of left upper arm pain around area of bicep.  Denies injury/trauma.  He states it just hurts badly.  No heavy lifting or strenuous exercise.  Review of Systems  Positive: Left arm pain Negative: fever  Physical Exam  BP (!) 150/89 (BP Location: Right Arm)   Pulse (!) 124   Temp 97.9 F (36.6 C) (Oral)   Resp (!) 30   Ht 5\' 9"  (1.753 m)   Wt 85.7 kg   SpO2 100%   BMI 27.91 kg/m   Gen:   Awake, no distress, sobbing in triage Resp:  Normal effort  MSK:   Moves extremities without difficulty  Other:  Left arm held flexed against the chest, tender over bicep without noted deformity/bulge, compartments are soft and compressible, radial pulse intact  Medical Decision Making  Medically screening exam initiated at 4:59 AM.  Appropriate orders placed.  Jon Hayes was informed that the remainder of the evaluation will be completed by another provider, this initial triage assessment does not replace that evaluation, and the importance of remaining in the ED until their evaluation is complete.  Left arm pain.  Unclear etiology as no inciting injury, strenuous exercise, or heavy lifting.  No deformity noted on exam.  Arm is NVI.  X-ray ordered.   Penni Bombard, PA-C 04/17/22 [phone]

## 2022-04-17 NOTE — ED Triage Notes (Addendum)
Pt arrived POV from home c/o severe left arm pain. Pt is upset and hyperventilating in triage because of the pain. Pt states the pain has been off and on for a few days but got so bad today it woke him out of his sleep. Pt also states that arm has been weak and he has been unable to lift it. Pt denies any falls, trauma or injuries.

## 2022-04-17 NOTE — Discharge Instructions (Addendum)
Take the Naprosyn twice daily with food and water.  Do stretches to your shoulder as you are able to.  Call and schedule an appoint with orthopedics for reevaluation.

## 2022-04-17 NOTE — ED Provider Notes (Signed)
[facility] EMERGENCY DEPARTMENT Provider Note   CSN: [id number] Arrival date & time: 04/17/22  3419     History  Chief Complaint  Patient presents with   Arm Pain    Jon Hayes is a 26 y.o. male.   Arm Pain     Patient with no documented comorbidities presents to the ED secondary to the left upper extremity pain.  Is been happening intermittently for the past few days but starting last night it became constant.  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  Denies any paresthesias, not having any chest pain or feeling short of breath.  Its worse whenever he moves the arm, he has not tried any pain medicine prior to arrival.  Home Medications Prior to Admission medications   Medication Sig Start Date End Date Taking? Authorizing Provider  naproxen (NAPROSYN) 500 MG tablet Take 1 tablet (500 mg total) by mouth 2 (two) times daily. 04/17/22  Yes Theron Arista, PA-C  doxycycline (VIBRAMYCIN) 100 MG capsule Take 1 capsule (100 mg total) by mouth 2 (two) times daily. 07/14/21   Gilda Crease, MD      Allergies    Patient has no known allergies.    Review of Systems   Review of Systems  Physical Exam Updated Vital Signs BP (!) 150/89 (BP Location: Right Arm)   Pulse 84   Temp 97.9 F (36.6 C) (Oral)   Resp (!) 30   Ht 5\' 9"  (1.753 m)   Wt 85.7 kg   SpO2 99%   BMI 27.91 kg/m  Physical Exam Vitals and nursing note reviewed. Exam conducted with a chaperone present.  Constitutional:      General: He is not in acute distress.    Appearance: Normal appearance.  HENT:     Head: Normocephalic and atraumatic.  Eyes:     General: No scleral icterus.    Extraocular Movements: Extraocular movements intact.     Pupils: Pupils are equal, round, and reactive to light.  Cardiovascular:     Pulses: Normal pulses.  Musculoskeletal:        General: Tenderness present.     Comments: Decreased active  and passive ROM to left shoulder secondary to pain.  Able to range elbow, wrist without difficulty.  Skin:    Capillary Refill: Capillary refill takes less than 2 seconds.     Coloration: Skin is not jaundiced.  Neurological:     Mental Status: He is alert. Mental status is at baseline.     Coordination: Coordination normal.     Comments: Sensation light touch is grossly intact     ED Results / Procedures / Treatments   Labs (all labs ordered are listed, but only abnormal results are displayed) Labs Reviewed - No data to display  EKG None  Radiology DG Humerus Left  Result Date: 04/17/2022 CLINICAL DATA:  27 year old male with history of left arm pain. EXAM: LEFT HUMERUS - 2+ VIEW COMPARISON:  No priors. FINDINGS: There is no evidence of fracture or other focal bone lesions. Soft tissues are unremarkable. IMPRESSION: Negative. Electronically Signed   By: 30 M.D.   On: 04/17/2022 05:53    Procedures Procedures    Medications Ordered in ED Medications  ondansetron (ZOFRAN-ODT) disintegrating tablet 8 mg (8 mg Oral Given 04/17/22 0715)  oxyCODONE-acetaminophen (PERCOCET/ROXICET) 5-325 MG per tablet 1 tablet (1 tablet Oral Given 04/17/22 0715)    ED Course/  Medical Decision Making/ A&P                           Medical Decision Making Risk Prescription drug management.   Patient presents due to left upper arm/shoulder pain.  Differential includes but not limited to fractures, dislocations, septic joint, adhesive capsulitis, cellulitis, myalgias, atypical ACS.  On exam patient is neurovascularly intact.  He has decreased active and passive ROM secondary to pain but is able to mobilize joints above and below.  He has a regular rate and rhythm, not having any chest pain in [place] without risk factors so I do not think this would be ACS.  There is no skin discoloration or warmth that be suggestive of a cellulitis, he is able to tolerate ROM and I do not think this is  likely a septic joint.    I reviewed the plain film of the shoulder which is negative for any acute process or fracture.  I agree with radiologist.  I suspect patient likely has adhesive capsulitis, advised orthopedic follow-up, PT, anti-inflammatories and return precautions.  Work note as well as anti-inflammatory sent to [facility].          Final Clinical Impression(s) / ED Diagnoses Final diagnoses:  Adhesive capsulitis of left shoulder    Rx / DC Orders ED Discharge Orders          Ordered    naproxen (NAPROSYN) 500 MG tablet  2 times daily        04/17/22 0717              Theron Arista, PA-C 04/17/22 2993    Lorre Nick, MD 04/18/22 [phone]

## 2023-04-12 DIAGNOSIS — Z113 Encounter for screening for infections with a predominantly sexual mode of transmission: Secondary | ICD-10-CM | POA: Diagnosis not present

## 2023-08-11 ENCOUNTER — Ambulatory Visit (HOSPITAL_COMMUNITY)
Admission: EM | Admit: 2023-08-11 | Discharge: 2023-08-11 | Disposition: A | Discharge status: Home / Self Care | Payer: MEDICAID

## 2023-08-11 ENCOUNTER — Encounter (HOSPITAL_COMMUNITY): Payer: Self-pay

## 2023-08-11 DIAGNOSIS — N4889 Other specified disorders of penis: Secondary | ICD-10-CM | POA: Insufficient documentation

## 2023-08-11 DIAGNOSIS — Z113 Encounter for screening for infections with a predominantly sexual mode of transmission: Secondary | ICD-10-CM | POA: Insufficient documentation

## 2023-08-11 LAB — CYTOLOGY, (ORAL, ANAL, URETHRAL) ANCILLARY ONLY
Chlamydia: NEGATIVE
Comment: NEGATIVE
Comment: NEGATIVE
Comment: NORMAL
Neisseria Gonorrhea: NEGATIVE
Trichomonas: POSITIVE — AB

## 2023-08-11 LAB — HIV ANTIBODY (ROUTINE TESTING W REFLEX): HIV Screen 4th Generation wRfx: NONREACTIVE

## 2023-08-11 NOTE — Discharge Instructions (Addendum)
 You were tested today for potential STD exposure.  Your test results will take 1 to 3 days to return.  We will keep you updated on those results and send in any medications that are indicated into the pharmacy that we have on file.  In the meantime I recommend refraining from sexual activity until your test results are back are negative or until after you have completed an appropriate medication regimen as indicated by the results.  I recommend using a condom or another barrier method to help prevent STD transmission going forward.  Please make sure that you are communicating with your sexual partners about your symptoms and any positive test results that you may receive.

## 2023-08-11 NOTE — ED Provider Notes (Signed)
 [facility]    CSN: [id number] Arrival date & time: 08/11/23  4098      History   Chief Complaint Chief Complaint  Patient presents with   SEXUALLY TRANSMITTED DISEASE    HPI Jon Hayes is a 28 y.o. male.   HPI   He reports intermittent tingling sensation in tip of penis for about a week He denies pain, discharge, dysuria, pain with erection. He reports some pain after intercourse though He denies genital sores or rashes   He is sexually active with single male partner. He denies partner expressing concerns for STD or genital condition     History reviewed. No pertinent past medical history.  Patient Active Problem List   Diagnosis Date Noted   Adjustment disorder with mixed anxiety and depressed mood     Past Surgical History:  Procedure Laterality Date   WISDOM TOOTH EXTRACTION Bilateral 2013       Home Medications    Prior to Admission medications   Medication Sig Start Date End Date Taking? Authorizing Provider  valACYclovir (VALTREX) 500 MG tablet Take 500 mg by mouth daily. 08/06/23  Yes [provider]    Family History History reviewed. No pertinent family history.  Social History Social History   Tobacco Use   Smoking status: Former   Smokeless tobacco: Never  Advertising account planner   Vaping status: Every Day  Substance Use Topics   Alcohol use: No   Drug use: Not Currently    Frequency: 7.0 times per week    Types: Marijuana     Allergies   Patient has no known allergies.   Review of Systems Review of Systems  Constitutional:  Negative for chills and fever.  Gastrointestinal:  Negative for abdominal pain.  Genitourinary:  Positive for penile pain. Negative for dysuria, flank pain, frequency, genital sores, penile discharge, penile swelling, scrotal swelling and testicular pain.  Musculoskeletal:  Negative for arthralgias and myalgias.  Skin:  Negative for rash.     Physical Exam Triage Vital Signs ED Triage  Vitals  Encounter Vitals Group     BP 08/11/23 0954 (!) 141/80     Systolic BP Percentile --      Diastolic BP Percentile --      Pulse Rate 08/11/23 0954 79     Resp 08/11/23 0954 16     Temp 08/11/23 0954 97.8 F (36.6 C)     Temp Source 08/11/23 0954 Oral     SpO2 08/11/23 0954 94 %     Weight 08/11/23 0952 189 lb (85.7 kg)     Height 08/11/23 0952 5\' 9"  (1.753 m)     Head Circumference --      Peak Flow --      Pain Score 08/11/23 0952 0     Pain Loc --      Pain Education --      Exclude from Growth Chart --    No data found.  Updated Vital Signs BP (!) 141/80 (BP Location: Right Arm)   Pulse 79   Temp 97.8 F (36.6 C) (Oral)   Resp 16   Ht 5\' 9"  (1.753 m)   Wt 189 lb (85.7 kg)   SpO2 94%   BMI 27.91 kg/m   Visual Acuity Right Eye Distance:   Left Eye Distance:   Bilateral Distance:    Right Eye Near:   Left Eye Near:    Bilateral Near:     Physical Exam Vitals reviewed.  Constitutional:  General: He is awake.     Appearance: Normal appearance. He is well-developed and well-groomed.  HENT:     Head: Normocephalic and atraumatic.  Eyes:     Extraocular Movements: Extraocular movements intact.     Conjunctiva/sclera: Conjunctivae normal.  Pulmonary:     Effort: Pulmonary effort is normal.  Musculoskeletal:     Cervical back: Normal range of motion.  Neurological:     General: No focal deficit present.     Mental Status: He is alert and oriented to person, place, and time.     GCS: GCS eye subscore is 4. GCS verbal subscore is 5. GCS motor subscore is 6.  Psychiatric:        Attention and Perception: Attention normal.        Mood and Affect: Mood normal.        Speech: Speech normal.        Behavior: Behavior normal. Behavior is cooperative.      UC Treatments / Results  Labs (all labs ordered are listed, but only abnormal results are displayed) Labs Reviewed  HIV ANTIBODY (ROUTINE TESTING W REFLEX)  RPR  CYTOLOGY, (ORAL, ANAL,  URETHRAL) ANCILLARY ONLY    EKG   Radiology No results found.  Procedures Procedures (including critical care time)  Medications Ordered in UC Medications - No data to display  Initial Impression / Assessment and Plan / UC Course  I have reviewed the triage vital signs and the nursing notes.  Pertinent labs & imaging results that were available during my care of the patient were reviewed by me and considered in my medical decision making (see chart for details).      Final Clinical Impressions(s) / UC Diagnoses   Final diagnoses:  Screening examination for STD (sexually transmitted disease)   Patient presents today with concerns for mild tingling sensation at the tip of the penis for the past week.  He denies dysuria, abdominal pain, fever, chills, penile discharge, pain or swelling in the penis or testicles.  He reports that he is sexually active with single male partner and is unsure if they are having concerns for potential STD at this time.  He is requesting STD testing.  Will collect cytology swab for gonorrhea, chlamydia, trichomonas as well as RPR and HIV.  Reviewed with him that results will dictate further management.  Reviewed that results typically return in 1 to 3 days and he should refrain from sexual activity until results are negative for his completed an appropriate medication regimen.  Recommend using condoms or another barrier method to help prevent STD transmission.  Follow-up and return precautions were discussed during the visit.  Follow-up as needed    Discharge Instructions      You were tested today for potential STD exposure.  Your test results will take 1 to 3 days to return.  We will keep you updated on those results and send in any medications that are indicated into the pharmacy that we have on file.  In the meantime I recommend refraining from sexual activity until your test results are back are negative or until after you have completed an  appropriate medication regimen as indicated by the results.  I recommend using a condom or another barrier method to help prevent STD transmission going forward.  Please make sure that you are communicating with your sexual partners about your symptoms and any positive test results that you may receive.     ED Prescriptions   None  PDMP not reviewed this encounter.   Providence Crosby, PA-C 08/11/23 1035

## 2023-08-11 NOTE — ED Triage Notes (Signed)
 Patient here today to be tested for all Stds. Denies symptoms.

## 2023-08-12 ENCOUNTER — Telehealth (HOSPITAL_COMMUNITY): Payer: Self-pay

## 2023-08-12 ENCOUNTER — Telehealth (HOSPITAL_COMMUNITY): Payer: Self-pay | Admitting: *Deleted

## 2023-08-12 LAB — RPR: RPR Ser Ql: NONREACTIVE

## 2023-08-12 MED ORDER — METRONIDAZOLE 500 MG PO TABS: 2000.0000 mg | ORAL_TABLET | Freq: Once | ORAL | 0 refills | Status: AC

## 2023-08-12 NOTE — Telephone Encounter (Signed)
 Per protocol, pt requires tx with metronidazole. Attempted to reach patient x1. LVM.  Rx sent to pharmacy on file.

## 2023-08-12 NOTE — Telephone Encounter (Signed)
 Pt called and asked if he was given a single dose for treatment. I advised yes take all 4 tablets at once. He needs to abstain from sex for 7 days.   He asked if he could masturbate during those 7 days advised if he was masturbating alone yes but no activity with anyone else.

## 2023-11-21 ENCOUNTER — Emergency Department (HOSPITAL_COMMUNITY)
Admission: EM | Admit: 2023-11-21 | Discharge: 2023-11-22 | Discharge status: Against Medical Advice | Payer: MEDICAID | Attending: Emergency Medicine | Admitting: Emergency Medicine

## 2023-11-21 ENCOUNTER — Other Ambulatory Visit: Payer: Self-pay

## 2023-11-21 ENCOUNTER — Encounter (HOSPITAL_COMMUNITY): Payer: Self-pay

## 2023-11-21 DIAGNOSIS — Z5321 Procedure and treatment not carried out due to patient leaving prior to being seen by health care provider: Secondary | ICD-10-CM | POA: Insufficient documentation

## 2023-11-21 DIAGNOSIS — R1030 Lower abdominal pain, unspecified: Secondary | ICD-10-CM | POA: Diagnosis present

## 2023-11-21 NOTE — ED Notes (Signed)
Called for triage, no response.

## 2023-11-21 NOTE — ED Triage Notes (Signed)
 Pt arrives for follow up due to continuing symptoms of pain after ejaculation. Per pt, he was seen and diagnosed with BV and treated and still having symptoms. Pt denies urinary symptoms or discharge.

## 2024-06-05 ENCOUNTER — Ambulatory Visit: Payer: MEDICAID
